# Patient Record
Sex: Female | Born: 1955 | Race: White | Smoking: Never smoker
Health system: Southern US, Community
[De-identification: ages and names within clinical notes are randomized; demographics above are authoritative.]

## PROBLEM LIST (undated history)

## (undated) DIAGNOSIS — D649 Anemia, unspecified: Secondary | ICD-10-CM

## (undated) DIAGNOSIS — G43909 Migraine, unspecified, not intractable, without status migrainosus: Secondary | ICD-10-CM

## (undated) DIAGNOSIS — I82409 Acute embolism and thrombosis of unspecified deep veins of unspecified lower extremity: Secondary | ICD-10-CM

## (undated) HISTORY — DX: Migraine, unspecified, not intractable, without status migrainosus: G43.909

## (undated) HISTORY — DX: Acute embolism and thrombosis of unspecified deep veins of unspecified lower extremity: I82.409

## (undated) HISTORY — DX: Anemia, unspecified: D64.9

---

## 2008-08-05 ENCOUNTER — Ambulatory Visit: Payer: Self-pay | Admitting: Internal Medicine

## 2008-10-10 ENCOUNTER — Ambulatory Visit: Payer: Self-pay | Admitting: Internal Medicine

## 2008-11-28 LAB — HM MAMMOGRAPHY: HM Mammogram: NORMAL

## 2011-11-29 ENCOUNTER — Ambulatory Visit (INDEPENDENT_AMBULATORY_CARE_PROVIDER_SITE_OTHER): Payer: BC Managed Care – PPO | Admitting: Internal Medicine

## 2011-11-29 ENCOUNTER — Encounter: Payer: Self-pay | Admitting: Internal Medicine

## 2011-11-29 VITALS — BP 100/60 | HR 76 | Temp 98.7°F | Wt 114.0 lb

## 2011-11-29 DIAGNOSIS — R4189 Other symptoms and signs involving cognitive functions and awareness: Secondary | ICD-10-CM

## 2011-11-29 DIAGNOSIS — R5383 Other fatigue: Secondary | ICD-10-CM

## 2011-11-29 DIAGNOSIS — R419 Unspecified symptoms and signs involving cognitive functions and awareness: Secondary | ICD-10-CM | POA: Insufficient documentation

## 2011-11-29 DIAGNOSIS — Z13220 Encounter for screening for lipoid disorders: Secondary | ICD-10-CM

## 2011-11-29 DIAGNOSIS — G43109 Migraine with aura, not intractable, without status migrainosus: Secondary | ICD-10-CM | POA: Insufficient documentation

## 2011-11-29 DIAGNOSIS — G43909 Migraine, unspecified, not intractable, without status migrainosus: Secondary | ICD-10-CM | POA: Insufficient documentation

## 2011-11-29 MED ORDER — ONDANSETRON 4 MG PO TBDP: ORAL_TABLET | ORAL | Status: DC

## 2011-11-29 MED ORDER — SUMATRIPTAN SUCCINATE 50 MG PO TABS: 50.0000 mg | ORAL_TABLET | ORAL | Status: DC | PRN

## 2011-11-29 NOTE — Assessment & Plan Note (Addendum)
Given the recent change in pattern accompanied by tinnitus and vision changes I am or an MRI the brain to rule out acoustic schwannoma and a second tiny lesion. In the meantime she does not want abortive therapy but would rather try to try prophylactic therapy but would prefer to try abortive therapy at this time. Given her prescription for Imitrex 50 mg one tablet at onset of headache may repeat in 2 hours. I've also given her samples Naprelan to take at the same time as well as prescription for Zofran she's to take all 3 of these at once.

## 2011-11-29 NOTE — Progress Notes (Signed)
Patient ID: Lisa Lamb, female   DOB: 07/31/1955, 56 y.o.   MRN: [id number]  Patient Active Problem List  Diagnosis  . Migraine headache with aura  . Migraine headache  . Cognitive complaints    Subjective:  CC:   Chief Complaint  Patient presents with  . Migraine    Frequency of once a week.  Patient takes ASA.      HPI:   Lisa Lamb a 56 y.o. female who presents 1) recurrent headaches 2) cognitive difficulties. Lisa Lamb is a 66 her old white female with a remote history of migraine headaches who presents today the patient she was last seen in over a year and half ago he'll practice and no records are available. She states that for the last several months she has been having weekly migraine headaches lasting 4-5 days which start with a visual scotoma. She describes a white fuzzy spot that  appears in the center of her visual field  and increases in size and is very quickly accompanied by onset of  nausea and vomiting.. She also notes  visual phenomena with changes of light and dark alternating between the 2,  followed by blurring of vision which last for several days. The headaches last 4-5 days and for the last 3 or 4 days is hallmarked by a sharp pain that occurs on the right side of her head starting in the frontal area  and and being in the occipital area. No neck stiffness. She has no history of trauma. She has been taking N. medication called blueberry which has not been helping. She has cut out caffeine chocolate wheat and sugar in the last 2 weeks but has not seen any effect on the headaches yet.  she also notes persistent ringing in both ears.She has had an eye exam in the last year and has been prescribed reading glasses.  no history or exposure to loud noises. She denies any vertigo or ultrasounds of balance  2) Describes her cognitive changes as a brain fog which is intermittent. She reports having difficulty remembering the names of people that she is known for years when she is  put on a spot to introduce them. She has no trouble finding destinations or murmur she parked her car. She does note some recent development of insomnia with early morning wakening followed by several hours of sleeplessness. She does not note any increased stress although she does have some financial stressors regarding the medical bills from her 3 horses who are all losing her eyesight. She also has a son who has a history of seizure disorder. There several people in her family including her daughter who has a history of migraine disorder.    Past Medical History  Diagnosis Date  . Migraine headache     Past Surgical History  Procedure Date  . Cesarean section 1988         The following portions of the patient's history were reviewed and updated as appropriate: Allergies, current medications, and problem list.    Review of Systems:   12 Pt  review of systems was negative except those addressed in the HPI,     History   Social History  . Marital Status: Married    Spouse Name: N/A    Number of Children: N/A  . Years of Education: N/A   Occupational History  . Not on file.   Social History Main Topics  . Smoking status: Never Smoker   . Smokeless tobacco: Not  on file  . Alcohol Use: Yes  . Drug Use: No  . Sexually Active: Not on file   Other Topics Concern  . Not on file   Social History Narrative  . No narrative on file    Objective:  BP 100/60  Pulse 76  Temp 98.7 F (37.1 C) (Oral)  Wt 114 lb (51.71 kg)  SpO2 98%  General appearance: alert, cooperative and appears stated age Ears: normal TM's and external ear canals both ears Throat: lips, mucosa, and tongue normal; teeth and gums normal Neck: no adenopathy, no carotid bruit, supple, symmetrical, trachea midline and thyroid not enlarged, symmetric, no tenderness/mass/nodules Back: symmetric, no curvature. ROM normal. No CVA tenderness. Lungs: clear to auscultation bilaterally Heart: regular rate  and rhythm, S1, S2 normal, no murmur, click, rub or gallop Abdomen: soft, non-tender; bowel sounds normal; no masses,  no organomegaly Pulses: 2+ and symmetric Skin: Skin color, texture, turgor normal. No rashes or lesions Lymph nodes: Cervical, supraclavicular, and axillary nodes normal. Neuro: grossly nonfocal. Finger to nose intact no nystagmus. Negative Romberg    Assessment and Plan:  Migraine headache with aura Given the recent change in pattern accompanied by tinnitus and vision changes I am ordering an MRI the brain to rule out acoustic schwannoma and a space occupying lesion. In the meantime she does not want prophylactic therapy but would prefer to try abortive therapy at this time. I have given her prescription for Imitrex 50 mg one tablet at onset of headache may repeat in 2 hours. I've also given her samples Naprelan to take at the same time as well as prescription for Zofran she's to take all 3 of these at once.  Cognitive complaints She is postmenopausal her symptoms may be coming from menopause versus insomnia versus anxiety. Her she has not had blood work in over a year. I will check thyroid function as well as a screen for diabetes and hyperlipidemia.   Updated Medication List Outpatient Encounter Prescriptions as of 11/29/2011  Medication Sig Dispense Refill  . ondansetron (ZOFRAN-ODT) 4 MG disintegrating tablet 1 or 2 tablets every 8 hours as neded for nausea  30 tablet  3  . SUMAtriptan (IMITREX) 50 MG tablet Take 1 tablet (50 mg total) by mouth every 2 (two) hours as needed for migraine (MAXIMUM 2 DOSES IN 24 HR PERIOD).  10 tablet  0     Orders Placed This Encounter  Procedures  . MR Brain Wo Contrast  . Comprehensive metabolic panel  . CBC with Differential  . TSH  . Lipid panel    No Follow-up on file.

## 2011-11-29 NOTE — Assessment & Plan Note (Signed)
She is postmenopausal her symptoms may be coming from menopause versus insomnia versus anxiety. Her she has not had blood work in over a year. I will check thyroid function as well as a screen for diabetes and hyperlipidemia.

## 2011-11-29 NOTE — Patient Instructions (Addendum)
Use imitrex and naprelan and zofran at first sign of headache.  \May repeat zofran and imitrex if needed  MRI of brain to be ordered to rule out tumors

## 2011-12-02 ENCOUNTER — Ambulatory Visit: Payer: Self-pay | Admitting: Internal Medicine

## 2011-12-03 ENCOUNTER — Telehealth: Payer: Self-pay | Admitting: Internal Medicine

## 2011-12-03 NOTE — Telephone Encounter (Signed)
Left message on patient vm letting her know her mammogram results.

## 2011-12-03 NOTE — Telephone Encounter (Signed)
MRI of her brain that we got because of her increased headache pattern was normal. No masses strokes or abnormalities.

## 2011-12-13 ENCOUNTER — Encounter: Payer: Self-pay | Admitting: Internal Medicine

## 2011-12-20 ENCOUNTER — Ambulatory Visit: Payer: Self-pay | Admitting: Internal Medicine

## 2011-12-20 ENCOUNTER — Encounter: Payer: Self-pay | Admitting: Internal Medicine

## 2011-12-20 ENCOUNTER — Ambulatory Visit (INDEPENDENT_AMBULATORY_CARE_PROVIDER_SITE_OTHER): Payer: BC Managed Care – PPO | Admitting: Internal Medicine

## 2011-12-20 VITALS — BP 112/68 | HR 60 | Temp 98.2°F | Ht 65.5 in | Wt 116.0 lb

## 2011-12-20 DIAGNOSIS — M542 Cervicalgia: Secondary | ICD-10-CM | POA: Insufficient documentation

## 2011-12-20 NOTE — Assessment & Plan Note (Signed)
Recurrent pains suggestive of cervical radiculopathy. Has history of trauma from falling off a horse 6 months ago.  Plain films of cervical spine ordered.

## 2011-12-20 NOTE — Progress Notes (Signed)
Patient ID: ESTELLAR Lamb, female   DOB: Dec 07, 1955, 56 y.o.   MRN: [id number]  Patient Active Problem List  Diagnosis  . Migraine headache with aura  . Migraine headache  . Cognitive complaints  . Cervicalgia    Subjective:  CC:   Chief Complaint  Patient presents with  . Follow-up    HPI:   Lisa Lamb a 56 y.o. female who presents   Past Medical History  Diagnosis Date  . Migraine headache     Past Surgical History  Procedure Date  . Cesarean section 1988      Follow up on migraines .  Normal MRI.,  Has not had a migraine in several weeks.  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  The sharp pains occurr when she drives and when she feels stressed out.  Not occurring on a daily basis but last for a few minutes.  Not sleeping well secondary to neck pain.  No MVAs but has fallen off her horse six months ago.      The following portions of the patient's history were reviewed and updated as appropriate: Allergies, current medications, and problem list.    Review of Systems:   12 Pt  review of systems was negative except those addressed in the HPI,     History   Social History  . Marital Status: Married    Spouse Name: N/A    Number of Children: N/A  . Years of Education: N/A   Occupational History  . Not on file.   Social History Main Topics  . Smoking status: Never Smoker   . Smokeless tobacco: Not on file  . Alcohol Use: Yes  . Drug Use: No  . Sexually Active: Not on file   Other Topics Concern  . Not on file   Social History Narrative  . No narrative on file    Objective:  BP 112/68  Pulse 60  Temp 98.2 F (36.8 C) (Oral)  Ht 5' 5.5" (1.664 m)  Wt 116 lb (52.617 kg)  BMI 19.01 kg/m2  SpO2 97%  General appearance: alert, cooperative and appears stated age Ears: normal TM's and external ear canals both  ears Throat: lips, mucosa, and tongue normal; teeth and gums normal Neck: no adenopathy, no carotid bruit, supple, symmetrical, trachea midline and thyroid not enlarged, symmetric, no tenderness/mass/nodules Back: symmetric, no curvature. ROM normal. No CVA tenderness. Lungs: clear to auscultation bilaterally Heart: regular rate and rhythm, S1, S2 normal, no murmur, click, rub or gallop Abdomen: soft, non-tender; bowel sounds normal; no masses,  no organomegaly Pulses: 2+ and symmetric Skin: Skin color, texture, turgor normal. No rashes or lesions Lymph nodes: Cervical, supraclavicular, and axillary nodes normal.  Assessment and Plan:  Cervicalgia Recurrent pains suggestive of cervical radiculopathy. Has history of trauma from falling off a horse 6 months ago.  Plain films of cervical spine ordered.    Updated Medication List Outpatient Encounter Prescriptions as of 12/20/2011  Medication Sig Dispense Refill  . ondansetron (ZOFRAN-ODT) 4 MG disintegrating tablet 1 or 2 tablets every 8 hours as neded for nausea  30 tablet  3  . SUMAtriptan (IMITREX) 50 MG tablet Take 1 tablet (50 mg total) by mouth every 2 (two) hours as needed for migraine (MAXIMUM 2 DOSES IN 24 HR PERIOD).  10 tablet  0  Orders Placed This Encounter  Procedures  . DG Cervical Spine Complete  . Ambulatory referral to Neurology    No Follow-up on file.

## 2011-12-22 ENCOUNTER — Telehealth: Payer: Self-pay | Admitting: Internal Medicine

## 2011-12-22 NOTE — Telephone Encounter (Signed)
The x rays done at [facility] of her neck did not show any alignment issues or fractures. I would like to send her to a headache clinic if she agrees.

## 2011-12-27 ENCOUNTER — Encounter: Payer: Self-pay | Admitting: Internal Medicine

## 2012-03-06 ENCOUNTER — Other Ambulatory Visit: Payer: Self-pay | Admitting: Internal Medicine

## 2012-03-06 NOTE — Telephone Encounter (Signed)
Refill sumatriptan 50 mg .

## 2012-03-07 ENCOUNTER — Other Ambulatory Visit: Payer: Self-pay | Admitting: General Practice

## 2012-03-07 DIAGNOSIS — G43109 Migraine with aura, not intractable, without status migrainosus: Secondary | ICD-10-CM

## 2012-03-07 MED ORDER — SUMATRIPTAN SUCCINATE 50 MG PO TABS: 50.0000 mg | ORAL_TABLET | ORAL | Status: DC | PRN

## 2012-03-07 NOTE — Telephone Encounter (Signed)
imitrex filled on 03/07/12.

## 2012-04-10 ENCOUNTER — Other Ambulatory Visit: Payer: Self-pay | Admitting: General Practice

## 2012-04-10 DIAGNOSIS — G43109 Migraine with aura, not intractable, without status migrainosus: Secondary | ICD-10-CM

## 2012-04-10 MED ORDER — SUMATRIPTAN SUCCINATE 50 MG PO TABS: 50.0000 mg | ORAL_TABLET | ORAL | Status: DC | PRN

## 2012-04-28 ENCOUNTER — Encounter: Payer: Self-pay | Admitting: Internal Medicine

## 2012-04-28 ENCOUNTER — Ambulatory Visit (INDEPENDENT_AMBULATORY_CARE_PROVIDER_SITE_OTHER): Payer: BC Managed Care – PPO | Admitting: Internal Medicine

## 2012-04-28 VITALS — BP 114/60 | HR 73 | Temp 98.3°F | Resp 16 | Wt 108.0 lb

## 2012-04-28 DIAGNOSIS — G43109 Migraine with aura, not intractable, without status migrainosus: Secondary | ICD-10-CM

## 2012-04-28 DIAGNOSIS — I8390 Asymptomatic varicose veins of unspecified lower extremity: Secondary | ICD-10-CM

## 2012-04-28 DIAGNOSIS — M542 Cervicalgia: Secondary | ICD-10-CM

## 2012-04-28 MED ORDER — SUMATRIPTAN 20 MG/ACT NA SOLN: 1.0000 | NASAL | Status: DC | PRN

## 2012-04-28 NOTE — Progress Notes (Signed)
Patient ID: Lisa Lamb, female   DOB: 1955-07-25, 57 y.o.   MRN: [id number]   Patient Active Problem List  Diagnosis  . Migraine headache with aura  . Migraine headache  . Cognitive complaints  . Cervicalgia    Subjective:  CC:   Chief Complaint  Patient presents with  . Headache  . Discuss Veins in legs    HPI:   Lisa Greenwell Floodis a 57 y.o. female who presents for follow up on  1) Recurrent headaches.  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 She finds that the Imitrex is working occasionally on her migraines but not always. She has noted that her frequency of migraines has been reduced from 5 per month to 2 or 3 per month with avoidance of caffeine and other dietary restraints.   2) painful  disfiguring varicose veins. She is requesting referral to vascular surgeon definitive treatment.   Past Medical History  Diagnosis Date  . Migraine headache     Past Surgical History  Procedure Laterality Date  . Cesarean section  1988       The following portions of the patient's history were reviewed and updated as appropriate: Allergies, current medications, and problem list.    Review of Systems:   Patient denies headache, fevers, malaise, unintentional weight loss, skin rash, eye pain, sinus congestion and sinus pain, sore throat, dysphagia,  hemoptysis , cough, dyspnea, wheezing, chest pain, palpitations, orthopnea, edema, abdominal pain, nausea, melena, diarrhea, constipation, flank pain, dysuria, hematuria, urinary  Frequency, nocturia, numbness, tingling, seizures,  Focal weakness, Loss of consciousness,  Tremor, insomnia, depression, anxiety, and suicidal ideation.      History   Social History  . Marital Status: Married    Spouse Name: N/A    Number of Children: N/A  . Years of Education: N/A   Occupational History  . Not on file.   Social History Main Topics  . Smoking status: Never  Smoker   . Smokeless tobacco: Not on file  . Alcohol Use: Yes  . Drug Use: No  . Sexually Active: Not on file   Other Topics Concern  . Not on file   Social History Narrative  . No narrative on file    Objective:  BP 114/60  Pulse 73  Temp(Src) 98.3 F (36.8 C) (Oral)  Resp 16  Wt 108 lb (48.988 kg)  BMI 17.69 kg/m2  SpO2 98%  General appearance: alert, cooperative and appears stated age Ears: normal TM's and external ear canals both ears Throat: lips, mucosa, and tongue normal; teeth and gums normal Neck: no adenopathy, no carotid bruit, supple, symmetrical, trachea midline and thyroid not enlarged, symmetric, no tenderness/mass/nodules Back: symmetric, no curvature. ROM normal. No CVA tenderness. Lungs: clear to auscultation bilaterally Heart: regular rate and rhythm, S1, S2 normal, no murmur, click, rub or gallop Abdomen: soft, non-tender; bowel sounds normal; no masses,  no organomegaly Pulses: 2+ and symmetric Skin: Skin color, texture, turgor normal. No rashes or lesions Lymph nodes: Cervical, supraclavicular, and axillary nodes normal.  Assessment and Plan:  Migraine headache with aura Continue use of Imitrex as needed. We discussed prophylactic/preventive therapy with daily use of Elavil.she does not want to start preventive therapy at this time. Her main concern was wondering in she has a headache which is about whether she is having a stroke. I've reassured her that she has no risk factors for stroke. Her MRI last October was  normal. She has no history of hypertension blood clots tobacco history or hyperlipidemia.she is requesting referral to a "functional neurologist" and has a Charity fundraiser in [place] in mind. She will call in with his name to arrange referral.  Cervicalgia She is requesting referral to chiropractor. I have recommended Dr. Patrici Ranks since he uses  chiropractic medicine as well as acupuncture to treat headaches fro cervical disk disease.  Marland Kitchen   Updated Medication List Outpatient Encounter Prescriptions as of 04/28/2012  Medication Sig Dispense Refill  . ondansetron (ZOFRAN-ODT) 4 MG disintegrating tablet 1 or 2 tablets every 8 hours as neded for nausea  30 tablet  3  . [DISCONTINUED] SUMAtriptan (IMITREX) 50 MG tablet Take 1 tablet (50 mg total) by mouth every 2 (two) hours as needed for migraine (MAXIMUM 2 DOSES IN 24 HR PERIOD).  10 tablet  0  . SUMAtriptan (IMITREX) 20 MG/ACT nasal spray Place 1 spray (20 mg total) into the nose every 2 (two) hours as needed for migraine.  4 Inhaler  1   No facility-administered encounter medications on file as of 04/28/2012.     Orders Placed This Encounter  Procedures  . Ambulatory referral to Vascular Surgery    No Follow-up on file.

## 2012-04-28 NOTE — Patient Instructions (Addendum)
Return for fasting labs.  Make an appt for the lab visit   E mail me the name of the neurologist you want to see.  Dr. Patrici Ranks is the chiropractor I recommend,   Referral to Dr Wyn Quaker  Or Dr Gilda Crease. For treatment of your vascular veins.

## 2012-04-30 ENCOUNTER — Encounter: Payer: Self-pay | Admitting: Internal Medicine

## 2012-04-30 NOTE — Assessment & Plan Note (Addendum)
Continue use of Imitrex as needed. We discussed prophylactic/preventive therapy with daily use of Elavil.she does not want to start preventive therapy at this time. Her main concern was wondering in she has a headache which is about whether she is having a stroke. I've reassured her that she has no risk factors for stroke. Her MRI last October was normal. She has no history of hypertension blood clots tobacco history or hyperlipidemia.she is requesting referral to a "functional neurologist" and has a Charity fundraiser in [place] in mind. She will call in with his name to arrange referral.

## 2012-04-30 NOTE — Assessment & Plan Note (Signed)
She is requesting referral to chiropractor. I have recommended Dr. Patrici Ranks since he uses  chiropractic medicine as well as acupuncture to treat headaches fro cervical disk disease. Marland Kitchen

## 2012-05-24 ENCOUNTER — Telehealth: Payer: Self-pay | Admitting: Emergency Medicine

## 2012-05-24 NOTE — Telephone Encounter (Signed)
The patient called earlier today and stated she was aggravted with [facility], and the pt wanted to see [facility]. I have made her an apt for June 2nd. She is aware of this apt. I told her that I would call [facility] to see what the issue is on why they keep "putting her off".

## 2012-05-26 ENCOUNTER — Other Ambulatory Visit: Payer: Self-pay

## 2012-05-26 DIAGNOSIS — I83893 Varicose veins of bilateral lower extremities with other complications: Secondary | ICD-10-CM

## 2012-06-22 ENCOUNTER — Telehealth: Payer: Self-pay | Admitting: Internal Medicine

## 2012-06-22 NOTE — Telephone Encounter (Signed)
Pt needing refill of Imitrex.  Wants to go back to 50 mg tablets.

## 2012-06-27 ENCOUNTER — Other Ambulatory Visit: Payer: Self-pay | Admitting: Internal Medicine

## 2012-06-27 NOTE — Telephone Encounter (Signed)
Pt wants to go back to 50 mg tablets, ok to refill?

## 2012-07-03 ENCOUNTER — Telehealth: Payer: Self-pay | Admitting: Internal Medicine

## 2012-07-03 NOTE — Telephone Encounter (Signed)
Received Emergent call from this pt but no one was on the line.  I called her name several times with no response.  I hung up an immediately called her back and then got a busy signal.

## 2012-07-03 NOTE — Telephone Encounter (Signed)
i see nothing I can schedule your appointments are full please advise.

## 2012-07-03 NOTE — Telephone Encounter (Signed)
Patient wants to be seen this week . She did not want to see the nurse practitioner or be transferred back to the triage nurse.

## 2012-07-03 NOTE — Telephone Encounter (Signed)
I cannot keep overbooking. If she does not want to see Raquel, she will have to wait until I have a cancellation

## 2012-07-04 ENCOUNTER — Ambulatory Visit (INDEPENDENT_AMBULATORY_CARE_PROVIDER_SITE_OTHER): Payer: BC Managed Care – PPO | Admitting: Adult Health

## 2012-07-04 ENCOUNTER — Ambulatory Visit: Payer: Self-pay

## 2012-07-04 ENCOUNTER — Encounter: Payer: Self-pay | Admitting: Adult Health

## 2012-07-04 VITALS — BP 98/56 | HR 68 | Temp 98.5°F | Resp 12 | Wt 108.0 lb

## 2012-07-04 DIAGNOSIS — M7989 Other specified soft tissue disorders: Secondary | ICD-10-CM

## 2012-07-04 NOTE — Progress Notes (Signed)
  Subjective:    Patient ID: Lisa Lamb, female    DOB: 03-05-1955, 57 y.o.   MRN: [id number]  HPI  Patient is a pleasant 57 year old female who presents to clinic with the following concerns: Left upper extremity soreness, edema. Arm is larger than the right. She reports the area on the left upper extremity is warm to touch. She has also felt "bumps" that have been traveling up her arm. Hx of right lower extremity blood clot. She denies any chest pain or shortness of breath.   Current Outpatient Prescriptions on File Prior to Visit  Medication Sig Dispense Refill  . ondansetron (ZOFRAN-ODT) 4 MG disintegrating tablet 1 or 2 tablets every 8 hours as neded for nausea  30 tablet  3  . SUMAtriptan (IMITREX) 50 MG tablet take 1 tablet by mouth every 2 hours if needed for migraines (MAXIMUM OF 2 DOSES IN A 24HR PERIOD)  10 tablet  5  . SUMAtriptan (IMITREX) 20 MG/ACT nasal spray Place 1 spray (20 mg total) into the nose every 2 (two) hours as needed for migraine.  4 Inhaler  1   No current facility-administered medications on file prior to visit.     Review of Systems  Constitutional: Negative.   Respiratory: Negative.   Cardiovascular:       Edema Left upper extremity  Psychiatric/Behavioral: Negative.       BP 98/56  Pulse 68  Temp(Src) 98.5 F (36.9 C) (Oral)  Resp 12  Wt 108 lb (48.988 kg)  BMI 17.69 kg/m2  SpO2 98%    Objective:   Physical Exam  Constitutional: She is oriented to person, place, and time. She appears well-developed and well-nourished. No distress.  Cardiovascular: Normal rate, regular rhythm, normal heart sounds and intact distal pulses.   Pulmonary/Chest: Effort normal and breath sounds normal. No respiratory distress. She has no wheezes. She has no rales.  Musculoskeletal: She exhibits edema.  Neurological: She is alert and oriented to person, place, and time.  Skin: Skin is warm and dry.  Slight erythema and warmth of the left upper extremity   Psychiatric: She has a normal mood and affect. Her behavior is normal. Judgment and thought content normal.          Assessment & Plan:

## 2012-07-04 NOTE — Assessment & Plan Note (Signed)
Rule out DVT. Send for ultrasound Doppler today.

## 2012-07-04 NOTE — Patient Instructions (Addendum)
  We are scheduling you for an upper extremity ultrasound Doppler today to evaluate for a blood clot.

## 2012-07-04 NOTE — Telephone Encounter (Signed)
Appointment made with Orville Govern NP today at 3.00 PM

## 2012-07-11 ENCOUNTER — Telehealth: Payer: Self-pay | Admitting: *Deleted

## 2012-07-11 DIAGNOSIS — R599 Enlarged lymph nodes, unspecified: Secondary | ICD-10-CM

## 2012-07-11 NOTE — Telephone Encounter (Signed)
Spoke with pt and notified of US Doppler of left arm results and recommending CT chest without contrast for noted enlarged lymph nodes per Orville Govern, NP. Pt states she is not a smoker and verbalized understanding of need for followup CT.

## 2012-07-12 NOTE — Telephone Encounter (Signed)
Returned you call.    [phone]  home

## 2012-07-18 ENCOUNTER — Ambulatory Visit: Payer: Self-pay | Admitting: Internal Medicine

## 2012-07-18 ENCOUNTER — Encounter: Payer: BC Managed Care – PPO | Admitting: Vascular Surgery

## 2012-07-19 ENCOUNTER — Telehealth: Payer: Self-pay | Admitting: Internal Medicine

## 2012-07-19 DIAGNOSIS — R599 Enlarged lymph nodes, unspecified: Secondary | ICD-10-CM

## 2012-07-19 NOTE — Telephone Encounter (Signed)
CT of the chest shows several enlarged lymph nodes. One of the lymph nodes is in the left axillary region. Referral to surgeon to see if any one of the lymph nodes may be biopsied.  CT chest showing enlarged left axillary lymph nodes and left subpectoral lymph node. 6 dominant nodules in right middle lobe. Patient initially presented to office with left swollen arm. Sent for ultrasound which was negative for DVT but showed lymph nodes enlarged. Then sent for CT.

## 2012-07-19 NOTE — Telephone Encounter (Signed)
Pt notified of results. Agrees with a referral to general surgeon, does not have preference. Pt will be out of town, leaving tomorrow, for 10 days, requests to be called with appointment first week in June, towards the end of the week. States will not be checking her cell phone while on vacation to receive calls.

## 2012-07-31 ENCOUNTER — Encounter: Payer: BC Managed Care – PPO | Admitting: Surgery

## 2012-08-10 ENCOUNTER — Ambulatory Visit (INDEPENDENT_AMBULATORY_CARE_PROVIDER_SITE_OTHER): Payer: BC Managed Care – PPO | Admitting: Surgery

## 2012-08-15 ENCOUNTER — Encounter: Payer: Self-pay | Admitting: Vascular Surgery

## 2012-08-16 ENCOUNTER — Encounter (INDEPENDENT_AMBULATORY_CARE_PROVIDER_SITE_OTHER): Payer: BC Managed Care – PPO | Admitting: Vascular Surgery

## 2012-08-16 ENCOUNTER — Encounter: Payer: Self-pay | Admitting: Vascular Surgery

## 2012-08-16 ENCOUNTER — Ambulatory Visit (INDEPENDENT_AMBULATORY_CARE_PROVIDER_SITE_OTHER): Payer: BC Managed Care – PPO | Admitting: Vascular Surgery

## 2012-08-16 VITALS — BP 114/56 | HR 77 | Ht 65.5 in | Wt 104.4 lb

## 2012-08-16 DIAGNOSIS — I83893 Varicose veins of bilateral lower extremities with other complications: Secondary | ICD-10-CM

## 2012-08-16 NOTE — Progress Notes (Signed)
Vascular and Vein Specialist of [facility]  Patient name: Lisa Lamb MRN: [id number] DOB: 24-Nov-1955 Sex: female  REASON FOR CONSULT: varicose veins.  HPI: Lisa Lamb is a 57 y.o. female who presents with a long history of varicose veins. She experiences aching pain especially in her right leg which is brought on by standing and sitting for a prolonged period of time. She has symptoms bilaterally but more significantly on the right side. There are no other aggravating or alleviating factors. She is unaware of any history of phlebitis. She does state that approximately 30 years ago, when she was in college, she had a DVT in the posterior aspect of her right calf. He does occasionally experience some right lower extremity swelling.  Past Medical History  Diagnosis Date  . Migraine headache   . DVT (deep venous thrombosis)   . Anemia    Family History  Problem Relation Age of Onset  . Cancer Mother 63    ovarian  . Heart disease Father     before age 50  . Cancer Father   . Heart attack Father   . Other Father     varicose veins  . Heart disease Maternal Grandmother   . Stroke Maternal Grandmother    SOCIAL HISTORY: History  Substance Use Topics  . Smoking status: Never Smoker   . Smokeless tobacco: Never Used  . Alcohol Use: 1.2 oz/week    2 Glasses of wine per week   No Known Allergies  Current Outpatient Prescriptions  Medication Sig Dispense Refill  . doxycycline (VIBRA-TABS) 100 MG tablet Take 1 tablet by mouth 2 (two) times daily.      . ondansetron (ZOFRAN-ODT) 4 MG disintegrating tablet 1 or 2 tablets every 8 hours as neded for nausea  30 tablet  3  . SUMAtriptan (IMITREX) 20 MG/ACT nasal spray Place 1 spray (20 mg total) into the nose every 2 (two) hours as needed for migraine.  4 Inhaler  1  . SUMAtriptan (IMITREX) 50 MG tablet take 1 tablet by mouth every 2 hours if needed for migraines (MAXIMUM OF 2 DOSES IN A 24HR PERIOD)  10 tablet  5   No current  facility-administered medications for this visit.   REVIEW OF SYSTEMS: Arly.Keller ] denotes positive finding; [  ] denotes negative finding  CARDIOVASCULAR:  [ ]  chest pain   [ ]  chest pressure   [ ]  palpitations   [ ]  orthopnea   [ ]  dyspnea on exertion   [ ]  claudication   [ ]  rest pain   [ ]  DVT   [ ]  phlebitis PULMONARY:   [ ]  productive cough   [ ]  asthma   [ ]  wheezing NEUROLOGIC:   [ ]  weakness  [ ]  paresthesias  [ ]  aphasia  [ ]  amaurosis  [ ]  dizziness HEMATOLOGIC:   [ ]  bleeding problems   [ ]  clotting disorders MUSCULOSKELETAL:  [ ]  joint pain   [ ]  joint swelling Arly.Keller ] leg swelling GASTROINTESTINAL: [ ]   blood in stool  [ ]   hematemesis GENITOURINARY:  [ ]   dysuria  [ ]   hematuria PSYCHIATRIC:  [ ]  history of major depression INTEGUMENTARY:  [ ]  rashes  [ ]  ulcers CONSTITUTIONAL:  [ ]  fever   [ ]  chills  PHYSICAL EXAM: Filed Vitals:   08/16/12 1149  BP: 114/56  Pulse: 77  Height: 5' 5.5" (1.664 m)  Weight: 104 lb 6.4 oz (47.356 kg)  SpO2: 100%  Body mass index is 17.1 kg/(m^2). GENERAL: The patient is a well-nourished female, in no acute distress. The vital signs are documented above. CARDIOVASCULAR: There is a regular rate and rhythm. I do not detect carotid bruits. She has palpable femoral pulses and palpable pedal pulses bilaterally. PULMONARY: There is good air exchange bilaterally without wheezing or rales. ABDOMEN: Soft and non-tender with normal pitched bowel sounds.  MUSCULOSKELETAL: There are no major deformities or cyanosis. NEUROLOGIC: No focal weakness or paresthesias are detected. SKIN: There are no ulcers or rashes noted. PSYCHIATRIC: The patient has a normal affect. She has some distended varicose veins in the anterior aspect of her right leg. Spider veins and telangiectasias bilaterally.  DATA:  I have independently interpreted her venous duplex scan. 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 She also has incompetence of the right lesser saphenous vein beneath this. This is likely the site where she had a previous DVT when she was in college 30 years ago. The great and small saphenous vein on the left are competent. She does have some deep venous reflux on the right in the popliteal vein.  MEDICAL ISSUES:  This patient has some evidence of chronic venous insufficiency on the right side. She has a reflux of the right popliteal vein and also the small saphenous vein. This is likely contributing to the varicose veins on the anterior aspect of her right leg. Currently the diameters of the small saphenous vein on the left are fairly small she is likely not a candidate for venous ablation of this segment. We have discussed the importance of intermittent leg elevation and the proper positioning for this. In addition I have written her a prescription for knee-high compression stockings with a mild gradient of 15-20 mm of mercury as I think she would be more likely to wear these during the summer when his hot. We'll be happy to see her back at any time if any new issues arise or her symptoms progress.  Jobe Mutch S [facility] Specialists of [place] [facility]: [phone]

## 2012-08-30 ENCOUNTER — Encounter: Payer: Self-pay | Admitting: Internal Medicine

## 2012-10-04 ENCOUNTER — Ambulatory Visit: Payer: BC Managed Care – PPO | Admitting: *Deleted

## 2012-12-12 ENCOUNTER — Encounter: Payer: Self-pay | Admitting: *Deleted

## 2012-12-13 ENCOUNTER — Encounter: Payer: Self-pay | Admitting: Vascular Surgery

## 2012-12-13 ENCOUNTER — Ambulatory Visit (INDEPENDENT_AMBULATORY_CARE_PROVIDER_SITE_OTHER): Payer: BC Managed Care – PPO | Admitting: *Deleted

## 2012-12-13 ENCOUNTER — Encounter (INDEPENDENT_AMBULATORY_CARE_PROVIDER_SITE_OTHER): Payer: Self-pay

## 2012-12-13 DIAGNOSIS — I781 Nevus, non-neoplastic: Secondary | ICD-10-CM | POA: Insufficient documentation

## 2012-12-13 NOTE — Progress Notes (Signed)
X=.3% Sotradecol administered with a 27g butterfly.  Patient received a total of 12cc.  Treated a large reticular vein on her right chin and scattered spider veins. Easy access and anticipate good results. Follow prn.  Photos: yes  Compression stockings applied: yes and an ace over the reticular

## 2012-12-20 ENCOUNTER — Ambulatory Visit: Payer: BC Managed Care – PPO | Admitting: *Deleted

## 2013-03-20 ENCOUNTER — Other Ambulatory Visit: Payer: Self-pay | Admitting: Internal Medicine

## 2013-04-06 ENCOUNTER — Encounter: Payer: Self-pay | Admitting: Internal Medicine

## 2013-04-06 ENCOUNTER — Ambulatory Visit (INDEPENDENT_AMBULATORY_CARE_PROVIDER_SITE_OTHER): Payer: BC Managed Care – PPO | Admitting: Internal Medicine

## 2013-04-06 VITALS — BP 108/60 | HR 70 | Temp 98.1°F | Resp 16 | Wt 109.0 lb

## 2013-04-06 DIAGNOSIS — Z1239 Encounter for other screening for malignant neoplasm of breast: Secondary | ICD-10-CM

## 2013-04-06 DIAGNOSIS — E559 Vitamin D deficiency, unspecified: Secondary | ICD-10-CM

## 2013-04-06 DIAGNOSIS — R5383 Other fatigue: Secondary | ICD-10-CM

## 2013-04-06 DIAGNOSIS — Z23 Encounter for immunization: Secondary | ICD-10-CM

## 2013-04-06 DIAGNOSIS — R419 Unspecified symptoms and signs involving cognitive functions and awareness: Secondary | ICD-10-CM

## 2013-04-06 DIAGNOSIS — M542 Cervicalgia: Secondary | ICD-10-CM

## 2013-04-06 DIAGNOSIS — R4589 Other symptoms and signs involving emotional state: Secondary | ICD-10-CM

## 2013-04-06 DIAGNOSIS — F410 Panic disorder [episodic paroxysmal anxiety] without agoraphobia: Secondary | ICD-10-CM

## 2013-04-06 DIAGNOSIS — G43109 Migraine with aura, not intractable, without status migrainosus: Secondary | ICD-10-CM

## 2013-04-06 DIAGNOSIS — R5381 Other malaise: Secondary | ICD-10-CM

## 2013-04-06 DIAGNOSIS — R4189 Other symptoms and signs involving cognitive functions and awareness: Secondary | ICD-10-CM

## 2013-04-06 DIAGNOSIS — Z13220 Encounter for screening for lipoid disorders: Secondary | ICD-10-CM

## 2013-04-06 DIAGNOSIS — F430 Acute stress reaction: Secondary | ICD-10-CM

## 2013-04-06 LAB — CBC WITH DIFFERENTIAL/PLATELET
BASOS ABS: 0 10*3/uL (ref 0.0–0.1)
Basophils Relative: 0.3 % (ref 0.0–3.0)
Eosinophils Absolute: 0.2 10*3/uL (ref 0.0–0.7)
Eosinophils Relative: 2.5 % (ref 0.0–5.0)
HEMATOCRIT: 39.4 % (ref 36.0–46.0)
HEMOGLOBIN: 12.8 g/dL (ref 12.0–15.0)
Lymphocytes Relative: 32.1 % (ref 12.0–46.0)
Lymphs Abs: 2.1 10*3/uL (ref 0.7–4.0)
MCHC: 32.6 g/dL (ref 30.0–36.0)
MCV: 91.9 fl (ref 78.0–100.0)
MONOS PCT: 8.1 % (ref 3.0–12.0)
Monocytes Absolute: 0.5 10*3/uL (ref 0.1–1.0)
NEUTROS ABS: 3.8 10*3/uL (ref 1.4–7.7)
Neutrophils Relative %: 57 % (ref 43.0–77.0)
Platelets: 236 10*3/uL (ref 150.0–400.0)
RBC: 4.28 Mil/uL (ref 3.87–5.11)
RDW: 14.4 % (ref 11.5–14.6)
WBC: 6.6 10*3/uL (ref 4.5–10.5)

## 2013-04-06 LAB — TSH: TSH: 0.47 u[IU]/mL (ref 0.35–5.50)

## 2013-04-06 MED ORDER — ALPRAZOLAM 0.5 MG PO TABS: 0.5000 mg | ORAL_TABLET | Freq: Every day | ORAL | Status: DC | PRN

## 2013-04-06 NOTE — Patient Instructions (Signed)
Trial of generic zanax to use as needed for panic attacks.  Start with 1/2 tablet repeat in 20 minutes if no relief    Insomnia Insomnia is frequent trouble falling and/or staying asleep. Insomnia can be a long term problem or a short term problem. Both are common. Insomnia can be a short term problem when the wakefulness is related to a certain stress or worry. Long term insomnia is often related to ongoing stress during waking hours and/or poor sleeping habits. Overtime, sleep deprivation itself can make the problem worse. Every little thing feels more severe because you are overtired and your ability to cope is decreased. CAUSES   Stress, anxiety, and depression.  Poor sleeping habits.  Distractions such as TV in the bedroom.  Naps close to bedtime.  Engaging in emotionally charged conversations before bed.  Technical reading before sleep.  Alcohol and other sedatives. They may make the problem worse. They can hurt normal sleep patterns and normal dream activity.  Stimulants such as caffeine for several hours prior to bedtime.  Pain syndromes and shortness of breath can cause insomnia.  Exercise late at night.  Changing time zones may cause sleeping problems (jet lag). It is sometimes helpful to have someone observe your sleeping patterns. They should look for periods of not breathing during the night (sleep apnea). They should also look to see how long those periods last. If you live alone or observers are uncertain, you can also be observed at a sleep clinic where your sleep patterns will be professionally monitored. Sleep apnea requires a checkup and treatment. Give your caregivers your medical history. Give your caregivers observations your family has made about your sleep.  SYMPTOMS   Not feeling rested in the morning.  Anxiety and restlessness at bedtime.  Difficulty falling and staying asleep. TREATMENT   Your caregiver may prescribe treatment for an underlying medical  disorders. Your caregiver can give advice or help if you are using alcohol or other drugs for self-medication. Treatment of underlying problems will usually eliminate insomnia problems.  Medications can be prescribed for short time use. They are generally not recommended for lengthy use.  Over-the-counter sleep medicines are not recommended for lengthy use. They can be habit forming.  You can promote easier sleeping by making lifestyle changes such as:  Using relaxation techniques that help with breathing and reduce muscle tension.  Exercising earlier in the day.  Changing your diet and the time of your last meal. No night time snacks.  Establish a regular time to go to bed.  Counseling can help with stressful problems and worry.  Soothing music and white noise may be helpful if there are background noises you cannot remove.  Stop tedious detailed work at least one hour before bedtime. HOME CARE INSTRUCTIONS   Keep a diary. Inform your caregiver about your progress. This includes any medication side effects. See your caregiver regularly. Take note of:  Times when you are asleep.  Times when you are awake during the night.  The quality of your sleep.  How you feel the next day. This information will help your caregiver care for you.  Get out of bed if you are still awake after 15 minutes. Read or do some quiet activity. Keep the lights down. Wait until you feel sleepy and go back to bed.  Keep regular sleeping and waking hours. Avoid naps.  Exercise regularly.  Avoid distractions at bedtime. Distractions include watching television or engaging in any intense or detailed activity like  attempting to balance the household checkbook.  Develop a bedtime ritual. Keep a familiar routine of bathing, brushing your teeth, climbing into bed at the same time each night, listening to soothing music. Routines increase the success of falling to sleep faster.  Use relaxation techniques.  This can be using breathing and muscle tension release routines. It can also include visualizing peaceful scenes. You can also help control troubling or intruding thoughts by keeping your mind occupied with boring or repetitive thoughts like the old concept of counting sheep. You can make it more creative like imagining planting one beautiful flower after another in your backyard garden.  During your day, work to eliminate stress. When this is not possible use some of the previous suggestions to help reduce the anxiety that accompanies stressful situations. MAKE SURE YOU:   Understand these instructions.  Will watch your condition.  Will get help right away if you are not doing well or get worse. Document Released: 02/13/2000 Document Revised: 05/10/2011 Document Reviewed: 03/15/2007 [facility] Patient Information 2014 [employer].

## 2013-04-06 NOTE — Progress Notes (Signed)
Pre-visit discussion using our clinic review tool. No additional management support is needed unless otherwise documented below in the visit note.  

## 2013-04-06 NOTE — Progress Notes (Signed)
Patient ID: Lisa Lamb, female   DOB: 06/01/1955, 58 y.o.   MRN: [id number]   Patient Active Problem List   Diagnosis Date Noted  . Panic attack as reaction to stress 04/07/2013  . Spider veins 12/13/2012  . Varicose veins of lower extremities with other complications 08/16/2012  . Left upper extremity swelling 07/04/2012  . Cervicalgia 12/20/2011  . Migraine headache with aura 11/29/2011  . Cognitive complaints 11/29/2011  . Migraine headache     Subjective:  CC:   Chief Complaint  Patient presents with  . Headache    medication for headaches follow up  . Follow-up    HPI:   Lisa Lamb is a 58 y.o. female who presents for Concerns about cognitive decline and Headaches.  Has had  chronic migraines since adolescence,  Managed with sumatriptan 50 mg.  She has been averaging  using weekly since the summer due to increase in stressors in her personal life. She notices a depressed mood  A day or two after using it.  States that she has thought about suicide on 2 or 3 occasions since November but has not had a plan or acted on it because she loves her children .      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 states that she had meltdown after bombing a MAC course. The instructor was going too fast and she could not follow along   She feel vastly unqualified and unskilled to work outside of the home . She has been out of the work force 19 years ago except for part time work at the [employer].  wakes up with a kntw in her stomach.,  Has had a few panic attacks, about  Once a week.  Does not want to take a medication that will make her feel numb.    Interested in seeing a functional neurologist tohelp her maximize her brain power and has requested a referall    Past Medical History  Diagnosis Date  . Migraine headache   . DVT (deep venous thrombosis)   . Anemia     Past Surgical History  Procedure  Laterality Date  . Cesarean section  1988       The following portions of the patient's history were reviewed and updated as appropriate: Allergies, current medications, and problem list.    Review of Systems:   Patient denies headache, fevers, malaise, unintentional weight loss, skin rash, eye pain, sinus congestion and sinus pain, sore throat, dysphagia,  hemoptysis , cough, dyspnea, wheezing, chest pain, palpitations, orthopnea, edema, abdominal pain, nausea, melena, diarrhea, constipation, flank pain, dysuria, hematuria, urinary  Frequency, nocturia, numbness, tingling, seizures,  Focal weakness, Loss of consciousness,  Tremor, insomnia, depression, anxiety, and suicidal ideation.     History   Social History  . Marital Status: Married    Spouse Name: N/A    Number of Children: N/A  . Years of Education: N/A   Occupational History  . Not on file.   Social History Main Topics  . Smoking status: Never Smoker   . Smokeless tobacco: Never Used  . Alcohol Use: 1.2 oz/week    2 Glasses of wine per week  . Drug Use: No  . Sexual Activity: Not on file   Other Topics Concern  . Not on file   Social History Narrative  . No narrative on file    Objective:  Filed Vitals:  04/06/13 0952  BP: 108/60  Pulse: 70  Temp: 98.1 F (36.7 C)  Resp: 16     General appearance: alert, cooperative and appears stated age Ears: normal TM's and external ear canals both ears Throat: lips, mucosa, and tongue normal; teeth and gums normal Neck: no adenopathy, no carotid bruit, supple, symmetrical, trachea midline and thyroid not enlarged, symmetric, no tenderness/mass/nodules Back: symmetric, no curvature. ROM normal. No CVA tenderness. Lungs: clear to auscultation bilaterally Heart: regular rate and rhythm, S1, S2 normal, no murmur, click, rub or gallop Abdomen: soft, non-tender; bowel sounds normal; no masses,  no organomegaly Pulses: 2+ and symmetric Skin: Skin color, texture,  turgor normal. No rashes or lesions Lymph nodes: Cervical, supraclavicular, and axillary nodes normal.  Assessment and Plan:  Migraine headache with aura Continue use of Imitrex as needed. We discussed prophylactic/preventive therapy with daily use of Elavil.she does not want to start preventive therapy at this time.  Her MRI last October 2013 was normal. She has no history of hypertension blood clots tobacco history or hyperlipidemia.she is requesting referral to a "functional neurologist" and has a Charity fundraiser  in mind.   Cognitive complaints MMSE was done and her score was 28/30.  Her naming was fluent.  Reassurance provided, referral in process as requested.   Panic attack as reaction to stress Trial of alprazolam prn. She is not interested in daily therapy. Discussed her previous thoughts about suicide  She is not contemplating suicide and has contracted for safety.   A total of 40 minutes was spent with patient more than half of which was spent in counseling, reviewing records from other prviders and coordination of care.  Updated Medication List Outpatient Encounter Prescriptions as of 04/06/2013  Medication Sig  . ondansetron (ZOFRAN-ODT) 4 MG disintegrating tablet 1 or 2 tablets every 8 hours as neded for nausea  . SUMAtriptan (IMITREX) 20 MG/ACT nasal spray Place 1 spray (20 mg total) into the nose every 2 (two) hours as needed for migraine.  . SUMAtriptan (IMITREX) 50 MG tablet take 1 tablet by mouth EVERY 2 HOURS IF NEEDED FOR MIGRAINES.  (MAXIMUM OF 2 DOSES IN A 24 HOUR PERIOD)  . ALPRAZolam (XANAX) 0.5 MG tablet Take 1 tablet (0.5 mg total) by mouth daily as needed for anxiety.  Marland Kitchen doxycycline (VIBRA-TABS) 100 MG tablet Take 1 tablet by mouth 2 (two) times daily.     Orders Placed This Encounter  Procedures  . MM DIGITAL SCREENING BILATERAL  . Tdap vaccine greater than or equal to 7yo IM  . CBC with Differential  . Comprehensive metabolic panel  . TSH  . Vit D   25 hydroxy (rtn osteoporosis monitoring)  . LDL cholesterol, direct  . Ambulatory referral to Neurology    No Follow-up on file.

## 2013-04-07 ENCOUNTER — Encounter: Payer: Self-pay | Admitting: Internal Medicine

## 2013-04-07 DIAGNOSIS — F430 Acute stress reaction: Secondary | ICD-10-CM

## 2013-04-07 DIAGNOSIS — F410 Panic disorder [episodic paroxysmal anxiety] without agoraphobia: Secondary | ICD-10-CM | POA: Insufficient documentation

## 2013-04-07 LAB — VITAMIN D 25 HYDROXY (VIT D DEFICIENCY, FRACTURES): VIT D 25 HYDROXY: 37 ng/mL (ref 30–89)

## 2013-04-07 NOTE — Assessment & Plan Note (Addendum)
Trial of alprazolam prn. She is not interested in daily therapy. Discussed her previous thoughts about suicide  She is not contemplating suicide and has contracted for safety.

## 2013-04-07 NOTE — Assessment & Plan Note (Signed)
MMSE was done and her score was 28/30.  Her naming was fluent.  Reassurance provided, referral in process as requested.

## 2013-04-07 NOTE — Assessment & Plan Note (Signed)
Continue use of Imitrex as needed. We discussed prophylactic/preventive therapy with daily use of Elavil.she does not want to start preventive therapy at this time.  Her MRI last October 2013 was normal. She has no history of hypertension blood clots tobacco history or hyperlipidemia.she is requesting referral to a "functional neurologist" and has a Charity fundraiserparticular practitioner  in mind.

## 2013-04-09 LAB — COMPREHENSIVE METABOLIC PANEL
ALBUMIN: 4.4 g/dL (ref 3.5–5.2)
ALK PHOS: 35 U/L — AB (ref 39–117)
ALT: 17 U/L (ref 0–35)
AST: 29 U/L (ref 0–37)
BILIRUBIN TOTAL: 0.5 mg/dL (ref 0.3–1.2)
BUN: 19 mg/dL (ref 6–23)
CO2: 26 mEq/L (ref 19–32)
Calcium: 9.5 mg/dL (ref 8.4–10.5)
Chloride: 105 mEq/L (ref 96–112)
Creatinine, Ser: 1 mg/dL (ref 0.4–1.2)
GFR: 59.38 mL/min — ABNORMAL LOW (ref >60.00)
GLUCOSE: 77 mg/dL (ref 70–99)
POTASSIUM: 4.1 meq/L (ref 3.5–5.1)
Sodium: 139 mEq/L (ref 135–145)
Total Protein: 7.4 g/dL (ref 6.0–8.3)

## 2013-04-09 LAB — LDL CHOLESTEROL, DIRECT: LDL DIRECT: 88.6 mg/dL

## 2013-04-10 ENCOUNTER — Encounter: Payer: Self-pay | Admitting: *Deleted

## 2013-05-29 ENCOUNTER — Telehealth: Payer: Self-pay | Admitting: Internal Medicine

## 2013-05-29 NOTE — Telephone Encounter (Signed)
FYI-pt declined evaluation in ED today, has appt with Dr. Darrick Huntsmanullo tomorrow morning

## 2013-05-29 NOTE — Telephone Encounter (Signed)
Patient Information:  Caller Name: Darl PikesSusan  Phone: [phone]  Patient: Lisa Lamb, Lisa Lamb  Gender: Female  DOB: 01/23/1956  Age: 58 Years  PCP: Duncan Dullullo, Teresa (Adults only)  Office Follow Up:  Does the office need to follow up with this patient?: No  Instructions For The Office: N/A  RN Note:  Spoke to nurse Becky at the office--she advised to go to ED. Triaged patient per [facility] protocol: QMV:HQIOEye:Pain or Vision Change and arrived at disposition: See ED Immediately. Advised pt to go to ED but she refused stating that she feels fine and has an appt at the office in am. Advised pt to call back or go to ED if she has any worsening sxs.  Symptoms  Reason For Call & Symptoms: Calling about blurred vision in right eye, also has a spot and wavy lines in front when looking straight ahead but peripheral vision is good, heart is racing and anxious. Has an appt scheduled at the office tomorrow morning.  Reviewed Health History In EMR: Yes  Reviewed Medications In EMR: Yes  Reviewed Allergies In EMR: Yes  Reviewed Surgeries / Procedures: Yes  Date of Onset of Symptoms: 05/27/2013  Guideline(s) Used:  No Protocol Available - Sick Adult  Disposition Per Guideline:   Go to ED Now  Reason For Disposition Reached:   Nursing judgment  Advice Given:  N/A  Patient Will Follow Care Advice:  YES

## 2013-05-30 ENCOUNTER — Ambulatory Visit: Payer: Self-pay | Admitting: Internal Medicine

## 2013-05-30 ENCOUNTER — Encounter: Payer: Self-pay | Admitting: Internal Medicine

## 2013-05-30 ENCOUNTER — Ambulatory Visit (INDEPENDENT_AMBULATORY_CARE_PROVIDER_SITE_OTHER): Payer: BC Managed Care – PPO | Admitting: Internal Medicine

## 2013-05-30 VITALS — BP 102/58 | HR 66 | Temp 98.0°F | Resp 18 | Wt 109.0 lb

## 2013-05-30 DIAGNOSIS — F410 Panic disorder [episodic paroxysmal anxiety] without agoraphobia: Secondary | ICD-10-CM

## 2013-05-30 DIAGNOSIS — F430 Acute stress reaction: Secondary | ICD-10-CM

## 2013-05-30 DIAGNOSIS — M79609 Pain in unspecified limb: Secondary | ICD-10-CM

## 2013-05-30 DIAGNOSIS — R040 Epistaxis: Secondary | ICD-10-CM

## 2013-05-30 DIAGNOSIS — R4589 Other symptoms and signs involving emotional state: Secondary | ICD-10-CM

## 2013-05-30 DIAGNOSIS — G43109 Migraine with aura, not intractable, without status migrainosus: Secondary | ICD-10-CM

## 2013-05-30 DIAGNOSIS — H539 Unspecified visual disturbance: Secondary | ICD-10-CM

## 2013-05-30 DIAGNOSIS — M19049 Primary osteoarthritis, unspecified hand: Secondary | ICD-10-CM

## 2013-05-30 DIAGNOSIS — M79644 Pain in right finger(s): Secondary | ICD-10-CM

## 2013-05-30 NOTE — Patient Instructions (Signed)
X ray of index finger right hand  Your eye exam was normal,  But I recommend having a formal eye exam with testing for glaucoma at [facility]  Continue prn use of low dose alprazolam. Call if you decide you need a daily medication for your anxiety

## 2013-05-30 NOTE — Progress Notes (Signed)
Pre-visit discussion using our clinic review tool. No additional management support is needed unless otherwise documented below in the visit note.  

## 2013-05-30 NOTE — Progress Notes (Signed)
Patient ID: Lisa Lamb, female   DOB: [birth year], 58 y.o.   MRN: [id number]   Patient Active Problem List   Diagnosis Date Noted  . Visual changes 06/01/2013  . OA (osteoarthritis) of finger 05/31/2013  . Panic attack as reaction to stress 04/07/2013  . Spider veins 12/13/2012  . Varicose veins of lower extremities with other complications 08/16/2012  . Cervicalgia 12/20/2011  . Migraine headache with aura 11/29/2011  . Cognitive complaints 11/29/2011  . Migraine headache     Subjective:  CC:   Chief Complaint  Patient presents with  . Acute Visit    blurred vision C 4days in right eye,  Second phalange on right hand swollen    HPI:   Lisa Lamb is a 58 y.o. female who presents for  History of Trauma to first finger 2 months ago during a fall.  Has had swollen PIP since then   Treated with injection for a trigger finger by a hand specialist 1 year ago,    Blurred vision .  Having extreme stress, and during an episode developed blurred vision in right eye following by seeing flashing lights behind closed eyelids.  Describes the phenomenom as similar  To the aura during her migraines, but has not had a migraine in a while.   No eye pain now but has felt pressure at the time.  Vision still blurred bur able to read.   Also been having blood streaked nasal discharge with clots which started on Monday.  Has been Intermittent for the  last couple of days when she blows her nose.  No sinus pain or signs of infection ,  No headaches  Last use of aspirin was on on Sunday night 650 mg     Past Medical History  Diagnosis Date  . Migraine headache   . DVT (deep venous thrombosis)   . Anemia     Past Surgical History  Procedure Laterality Date  . Cesarean section  1988       The following portions of the patient's history were reviewed and updated as appropriate: Allergies, current medications, and problem list.    Review of Systems:   Patient denies headache, fevers,  malaise, unintentional weight loss, skin rash, eye pain, sinus congestion and sinus pain, sore throat, dysphagia,  hemoptysis , cough, dyspnea, wheezing, chest pain, palpitations, orthopnea, edema, abdominal pain, nausea, melena, diarrhea, constipation, flank pain, dysuria, hematuria, urinary  Frequency, nocturia, numbness, tingling, seizures,  Focal weakness, Loss of consciousness,  Tremor, insomnia, depression, anxiety, and suicidal ideation.     History   Social History  . Marital Status: Married    Spouse Name: N/A    Number of Children: N/A  . Years of Education: N/A   Occupational History  . Not on file.   Social History Main Topics  . Smoking status: Never Smoker   . Smokeless tobacco: Never Used  . Alcohol Use: 1.2 oz/week    2 Glasses of wine per week  . Drug Use: No  . Sexual Activity: Not on file   Other Topics Concern  . Not on file   Social History Narrative  . No narrative on file    Objective:  Filed Vitals:   05/30/13 1031  BP: 102/58  Pulse: 66  Temp: 98 F (36.7 C)  Resp: 18     General appearance: alert, cooperative and appears stated age Ears: normal TM's and external ear canals both ears Throat: lips, mucosa, and tongue normal; teeth  and gums normal Neck: no adenopathy, no carotid bruit, supple, symmetrical, trachea midline and thyroid not enlarged, symmetric, no tenderness/mass/nodules Back: symmetric, no curvature. ROM normal. No CVA tenderness. Lungs: clear to auscultation bilaterally Heart: regular rate and rhythm, S1, S2 normal, no murmur, click, rub or gallop Abdomen: soft, non-tender; bowel sounds normal; no masses,  no organomegaly Pulses: 2+ and symmetric Skin: Skin color, texture, turgor normal. No rashes or lesions Lymph nodes: Cervical, supraclavicular, and axillary nodes normal.  Assessment and Plan:  Migraine headache with aura Increased frequency reported in February due to stressors,  But none in the past several weeks. No  changes today .   OA (osteoarthritis) of finger Confirmed with plain films,  No indication for orthopedic evaluation at this tinme.  Prn NSAIDs. s.    Panic attack as reaction to stress Continue  prn use of alprazolam for infrequent attacks.   Visual changes Neurologic and fundoscopic exam normal today. Referral to [facility] for formal evaluation.  Normal MRI brain Oct 2013     Updated Medication List Outpatient Encounter Prescriptions as of 05/30/2013  Medication Sig  . ALPRAZolam (XANAX) 0.5 MG tablet Take 1 tablet (0.5 mg total) by mouth daily as needed for anxiety.  . ondansetron (ZOFRAN-ODT) 4 MG disintegrating tablet 1 or 2 tablets every 8 hours as neded for nausea  . SUMAtriptan (IMITREX) 20 MG/ACT nasal spray Place 1 spray (20 mg total) into the nose every 2 (two) hours as needed for migraine.  . SUMAtriptan (IMITREX) 50 MG tablet take 1 tablet by mouth EVERY 2 HOURS IF NEEDED FOR MIGRAINES.  (MAXIMUM OF 2 DOSES IN A 24 HOUR PERIOD)  . doxycycline (VIBRA-TABS) 100 MG tablet Take 1 tablet by mouth 2 (two) times daily.     Orders Placed This Encounter  Procedures  . DG Finger Index Right    No Follow-up on file.

## 2013-05-31 ENCOUNTER — Telehealth: Payer: Self-pay | Admitting: Internal Medicine

## 2013-05-31 DIAGNOSIS — M19049 Primary osteoarthritis, unspecified hand: Secondary | ICD-10-CM

## 2013-05-31 NOTE — Telephone Encounter (Signed)
Left message for patient to call office.  

## 2013-05-31 NOTE — Telephone Encounter (Signed)
The x ray of rhe index finger showed moderate arthritis of the joint,  No fractures or erosions to suggest anthing other than plain old arhtritis

## 2013-06-01 ENCOUNTER — Encounter: Payer: Self-pay | Admitting: Internal Medicine

## 2013-06-01 DIAGNOSIS — H539 Unspecified visual disturbance: Secondary | ICD-10-CM | POA: Insufficient documentation

## 2013-06-01 DIAGNOSIS — R040 Epistaxis: Secondary | ICD-10-CM | POA: Insufficient documentation

## 2013-06-01 NOTE — Assessment & Plan Note (Addendum)
Confirmed with plain films,  No indication for orthopedic evaluation at this tinme.  Prn NSAIDs. s.

## 2013-06-01 NOTE — Assessment & Plan Note (Signed)
Continue  prn use of alprazolam for infrequent attacks.

## 2013-06-01 NOTE — Assessment & Plan Note (Signed)
Neurologic and fundoscopic exam normal today. Referral to [facility] for formal evaluation

## 2013-06-01 NOTE — Assessment & Plan Note (Signed)
Increased frequency reported in February due to stressors,  But none in the past several weeks. No changes today .

## 2013-06-01 NOTE — Assessment & Plan Note (Signed)
In the absence of headaches and facial pain, with normal HEENT exam,  Cause is likely benign process..  Recommended saline misting and use of vaseline, avoidance of excessive aspirin products.

## 2013-06-04 NOTE — Telephone Encounter (Signed)
Not able to reach patient left message with family member to have patient call office,

## 2013-06-05 ENCOUNTER — Encounter: Payer: Self-pay | Admitting: *Deleted

## 2013-06-05 NOTE — Telephone Encounter (Signed)
Letter mailed unable to reach patient by phone. 

## 2013-07-10 ENCOUNTER — Other Ambulatory Visit: Payer: Self-pay | Admitting: Internal Medicine

## 2013-07-10 NOTE — Telephone Encounter (Signed)
Ok refill? 

## 2013-07-24 ENCOUNTER — Encounter: Payer: Self-pay | Admitting: Internal Medicine

## 2013-09-24 ENCOUNTER — Ambulatory Visit (INDEPENDENT_AMBULATORY_CARE_PROVIDER_SITE_OTHER): Payer: BC Managed Care – PPO | Admitting: Internal Medicine

## 2013-09-24 ENCOUNTER — Encounter: Payer: Self-pay | Admitting: Internal Medicine

## 2013-09-24 VITALS — BP 108/60 | HR 76 | Temp 98.6°F | Resp 18 | Ht 65.5 in | Wt 100.0 lb

## 2013-09-24 DIAGNOSIS — R636 Underweight: Secondary | ICD-10-CM

## 2013-09-24 DIAGNOSIS — F5102 Adjustment insomnia: Secondary | ICD-10-CM

## 2013-09-24 DIAGNOSIS — G4700 Insomnia, unspecified: Secondary | ICD-10-CM

## 2013-09-24 DIAGNOSIS — R634 Abnormal weight loss: Secondary | ICD-10-CM

## 2013-09-24 DIAGNOSIS — F411 Generalized anxiety disorder: Secondary | ICD-10-CM | POA: Insufficient documentation

## 2013-09-24 MED ORDER — MIRTAZAPINE 7.5 MG PO TABS: 7.5000 mg | ORAL_TABLET | Freq: Every day | ORAL | Status: DC

## 2013-09-24 NOTE — Patient Instructions (Addendum)
I am starting you on mirtazapine to help you rest.  This is a medication that treats multiple symptoms   Take the dose  7.5 mg  at  9 pm.  If still awake at 11 pm ,  Take one alprazolam   Follow up with me in 2 weeks via Mychart  Here are the names of several well respected female therapists   Montel ClockKatherine Wagner    [phone]  [place] Karen [place]   [phone]  [place] Valerie HectorPadgett [phone]  [place] Jane Perrin  [phone]  Judithann SheenWhitsett

## 2013-09-24 NOTE — Progress Notes (Signed)
Pre-visit discussion using our clinic review tool. No additional management support is needed unless otherwise documented below in the visit note.  

## 2013-09-24 NOTE — Progress Notes (Signed)
Patient ID: Lisa MuslimSusan C Pennella, female   DOB: 03/25/1955, 58 y.o.   MRN: [id number]   Patient Active Problem List   Diagnosis Date Noted  . Insomnia due to psychological stress 09/25/2013  . Underweight 09/25/2013  . Generalized anxiety disorder 09/24/2013  . Visual changes 06/01/2013  . Epistaxis 06/01/2013  . OA (osteoarthritis) of finger 05/31/2013  . Panic attack as reaction to stress 04/07/2013  . Spider veins 12/13/2012  . Varicose veins of lower extremities with other complications 08/16/2012  . Cervicalgia 12/20/2011  . Migraine headache with aura 11/29/2011  . Cognitive complaints 11/29/2011  . Migraine headache     Subjective:  CC:   Chief Complaint  Patient presents with  . Anxiety    Patient stated feels the anxiety has gradually becoming worse and is at a plateau    HPI:   Lisa Lamb is a 58 y.o. female who presents for  Worsening anxiety.  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   In the past she has used herbal remedies to manage her insomnia . Vic RipperFir the past month she has tried various remedies including  1/2 xanax at night to help her sleep along with 50 mg benadryl at 6 pm, which left her feeling sedated well into the morning during waking hours.    She has lost 9 lbs since April due to anorexia, and is now underweight.   Past Medical History  Diagnosis Date  . Migraine headache   . DVT (deep venous thrombosis)   . Anemia     Past Surgical History  Procedure Laterality Date  . Cesarean section  1988       The following portions of the patient's history were reviewed and updated as appropriate: Allergies, current medications, and problem list.    Review of Systems:   Patient denies headache, fevers, malaise, unintentional weight loss, skin rash, eye pain, sinus congestion and  sinus pain, sore throat, dysphagia,  hemoptysis , cough, dyspnea, wheezing, chest pain, palpitations, orthopnea, edema, abdominal pain, nausea, melena, diarrhea, constipation, flank pain, dysuria, hematuria, urinary  Frequency, nocturia, numbness, tingling, seizures,  Focal weakness, Loss of consciousness,  Tremor, insomnia, depression, anxiety, and suicidal ideation.     History   Social History  . Marital Status: Married    Spouse Name: N/A    Number of Children: N/A  . Years of Education: N/A   Occupational History  . Not on file.   Social History Main Topics  . Smoking status: Never Smoker   . Smokeless tobacco: Never Used  . Alcohol Use: 1.2 oz/week    2 Glasses of wine per week  . Drug Use: No  . Sexual Activity: Not Currently   Other Topics Concern  . Not on file   Social History Narrative  . No narrative on file    Objective:  Filed Vitals:   09/24/13 1356  BP: 108/60  Pulse: 76  Temp: 98.6 F (37 C)  Resp: 18     General appearance: alert, cooperative and appears stated age Ears: normal TM's and external ear canals both ears Throat: lips, mucosa, and tongue normal; teeth and gums normal Neck: no adenopathy, no carotid bruit, supple, symmetrical, trachea midline and thyroid not enlarged, symmetric, no tenderness/mass/nodules Back: symmetric, no curvature. ROM normal. No CVA tenderness.  Lungs: clear to auscultation bilaterally Heart: regular rate and rhythm, S1, S2 normal, no murmur, click, rub or gallop Abdomen: soft, non-tender; bowel sounds normal; no masses,  no organomegaly Pulses: 2+ and symmetric Skin: Skin color, texture, turgor normal. No rashes or lesions Lymph nodes: Cervical, supraclavicular, and axillary nodes normal.  Assessment and Plan:  Generalized anxiety disorder Accompanied by insomnia, wt loss , and grief .  Trial or mirtazipine to improve appetite, insomnia and energy level. Prn alprazolam.  Patient was given names of several well  respected psychotherapists to contact for talk therapy.   Underweight Secondary to grief and anxiety.  Thyroid function was normal in February. Trial of remeron.     Updated Medication List Outpatient Encounter Prescriptions as of 09/24/2013  Medication Sig  . ALPRAZolam (XANAX) 0.5 MG tablet Take 1 tablet (0.5 mg total) by mouth daily as needed for anxiety.  . ondansetron (ZOFRAN-ODT) 4 MG disintegrating tablet dissolve 1 or 2 tablets ON TONGUE every 8 hours if needed for nausea  . SUMAtriptan (IMITREX) 20 MG/ACT nasal spray Place 1 spray (20 mg total) into the nose every 2 (two) hours as needed for migraine.  . SUMAtriptan (IMITREX) 50 MG tablet take 1 tablet by mouth EVERY 2 HOURS IF NEEDED FOR MIGRAINES.  (MAXIMUM OF 2 DOSES IN A 24 HOUR PERIOD)  . mirtazapine (REMERON) 7.5 MG tablet Take 1 tablet (7.5 mg total) by mouth at bedtime.  . [DISCONTINUED] doxycycline (VIBRA-TABS) 100 MG tablet Take 1 tablet by mouth 2 (two) times daily.     No orders of the defined types were placed in this encounter.    No Follow-up on file.

## 2013-09-25 ENCOUNTER — Encounter: Payer: Self-pay | Admitting: Internal Medicine

## 2013-09-25 DIAGNOSIS — R636 Underweight: Secondary | ICD-10-CM | POA: Insufficient documentation

## 2013-09-25 DIAGNOSIS — F5102 Adjustment insomnia: Secondary | ICD-10-CM | POA: Insufficient documentation

## 2013-09-25 NOTE — Assessment & Plan Note (Addendum)
Accompanied by insomnia, wt loss , and grief .  Trial or mirtazipine to improve appetite, insomnia and energy level. Prn alprazolam.  Patient was given names of several well respected psychotherapists to contact for talk therapy.

## 2013-09-25 NOTE — Assessment & Plan Note (Addendum)
Secondary to grief and anxiety.  Thyroid function was normal in February. Trial of remeron.

## 2013-10-24 ENCOUNTER — Other Ambulatory Visit: Payer: Self-pay | Admitting: Internal Medicine

## 2013-10-24 NOTE — Telephone Encounter (Signed)
Ok to refill,  printed rx  

## 2013-10-24 NOTE — Telephone Encounter (Signed)
Last reill 5.6.15, last OV 7.27.15.  Please advise refill.

## 2014-01-07 ENCOUNTER — Other Ambulatory Visit: Payer: Self-pay | Admitting: Internal Medicine

## 2014-01-28 ENCOUNTER — Telehealth: Payer: Self-pay

## 2014-01-28 NOTE — Telephone Encounter (Signed)
We generally do not change between providers.

## 2014-01-28 NOTE — Telephone Encounter (Signed)
The patient called hoping to switch from Dr.Tullo to Dr.Walker.  She did not state why.  Do you want this switch to go through?

## 2014-01-28 NOTE — Telephone Encounter (Signed)
Pretty sure we have a policy in place that does not allow patients to switch providers within the practice.  You can check with Dr Dan HumphreysWalker

## 2014-02-01 ENCOUNTER — Other Ambulatory Visit: Payer: Self-pay | Admitting: Internal Medicine

## 2014-02-01 ENCOUNTER — Other Ambulatory Visit: Payer: Self-pay | Admitting: *Deleted

## 2014-02-06 ENCOUNTER — Telehealth: Payer: Self-pay | Admitting: Internal Medicine

## 2014-02-06 NOTE — Telephone Encounter (Signed)
Pt called to request appt to get sleep meds. Please advise where to add pt/msn

## 2014-02-06 NOTE — Telephone Encounter (Signed)
First available not acute.

## 2014-02-27 ENCOUNTER — Other Ambulatory Visit: Payer: Self-pay | Admitting: Internal Medicine

## 2014-03-20 ENCOUNTER — Ambulatory Visit (INDEPENDENT_AMBULATORY_CARE_PROVIDER_SITE_OTHER): Payer: BLUE CROSS/BLUE SHIELD | Admitting: Internal Medicine

## 2014-03-20 ENCOUNTER — Encounter: Payer: Self-pay | Admitting: Internal Medicine

## 2014-03-20 VITALS — BP 122/64 | HR 69 | Temp 98.4°F | Resp 16 | Ht 66.0 in | Wt 107.2 lb

## 2014-03-20 DIAGNOSIS — F5102 Adjustment insomnia: Secondary | ICD-10-CM

## 2014-03-20 DIAGNOSIS — G43109 Migraine with aura, not intractable, without status migrainosus: Secondary | ICD-10-CM

## 2014-03-20 DIAGNOSIS — R419 Unspecified symptoms and signs involving cognitive functions and awareness: Secondary | ICD-10-CM

## 2014-03-20 MED ORDER — TRAZODONE HCL 50 MG PO TABS: 25.0000 mg | ORAL_TABLET | Freq: Every evening | ORAL | Status: DC | PRN

## 2014-03-20 MED ORDER — SUMATRIPTAN SUCCINATE 100 MG PO TABS: 100.0000 mg | ORAL_TABLET | Freq: Once | ORAL | Status: DC

## 2014-03-20 NOTE — Progress Notes (Signed)
Pre-visit discussion using our clinic review tool. No additional management support is needed unless otherwise documented below in the visit note.  

## 2014-03-20 NOTE — Progress Notes (Signed)
Patient ID: Lisa MuslimSusan C Lamb, female   DOB: 02/12/1956, 59 y.o.   MRN: [id number]  Patient Active Problem List   Diagnosis Date Noted  . Insomnia due to psychological stress 09/25/2013  . Underweight 09/25/2013  . Generalized anxiety disorder 09/24/2013  . Visual changes 06/01/2013  . Epistaxis 06/01/2013  . OA (osteoarthritis) of finger 05/31/2013  . Panic attack as reaction to stress 04/07/2013  . Spider veins 12/13/2012  . Varicose veins of lower extremities with other complications 08/16/2012  . Cervicalgia 12/20/2011  . Migraine headache with aura 11/29/2011  . Cognitive complaints 11/29/2011  . Migraine headache     Subjective:  CC:   Chief Complaint  Patient presents with  . Follow-up    medication refills    HPI:   Lisa MuslimSusan C Lamb is a 59 y.o. female who presents for Follow up on generalized anxiety,  fatigue and impaired concentration.  She was last seen in July .  She reports persistent difficulty concentrating, and persistent insomnia despite trial of remeron.. Which she discontinued.  She has gained weight but is not exercising regularly  And continues to report anhedonia and pessimistic outlook at times since her separation from husband,  No suicidality   In July 2015:   She presented with Worsening anxiety.  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   In the past she has used herbal remedies to manage her insomnia . Vic RipperFir the past month she has tried various remedies including  1/2 xanax at night to help her sleep along with 50 mg benadryl at 6 pm, which left her feeling sedated well into the morning during waking hours.     Past Medical History  Diagnosis Date  . Migraine headache   . DVT (deep venous thrombosis)   . Anemia     Past Surgical History  Procedure Laterality Date  . Cesarean  section  1988       The following portions of the patient's history were reviewed and updated as appropriate: Allergies, current medications, and problem list.    Review of Systems:   Patient denies headache, fevers, malaise, unintentional weight loss, skin rash, eye pain, sinus congestion and sinus pain, sore throat, dysphagia,  hemoptysis , cough, dyspnea, wheezing, chest pain, palpitations, orthopnea, edema, abdominal pain, nausea, melena, diarrhea, constipation, flank pain, dysuria, hematuria, urinary  Frequency, nocturia, numbness, tingling, seizures,  Focal weakness, Loss of consciousness,  Tremor, insomnia, depression, anxiety, and suicidal ideation.     History   Social History  . Marital Status: Married    Spouse Name: N/A    Number of Children: N/A  . Years of Education: N/A   Occupational History  . Not on file.   Social History Main Topics  . Smoking status: Never Smoker   . Smokeless tobacco: Never Used  . Alcohol Use: 1.2 oz/week    2 Glasses of wine per week  . Drug Use: No  . Sexual Activity: Not Currently   Other Topics Concern  . Not on file   Social History Narrative    Objective:  Filed Vitals:   03/20/14 1446  BP: 122/64  Pulse: 69  Temp: 98.4 F (36.9 C)  Resp: 16     General appearance: alert, cooperative and appears stated age Ears: normal TM's and external ear canals  both ears Throat: lips, mucosa, and tongue normal; teeth and gums normal Neck: no adenopathy, no carotid bruit, supple, symmetrical, trachea midline and thyroid not enlarged, symmetric, no tenderness/mass/nodules Back: symmetric, no curvature. ROM normal. No CVA tenderness. Lungs: clear to auscultation bilaterally Heart: regular rate and rhythm, S1, S2 normal, no murmur, click, rub or gallop Abdomen: soft, non-tender; bowel sounds normal; no masses,  no organomegaly Pulses: 2+ and symmetric Skin: Skin color, texture, turgor normal. No rashes or lesions Lymph nodes:  Cervical, supraclavicular, and axillary nodes normal.  Assessment and Plan:  Insomnia due to psychological stress Trial of trazodone starting with 25 mg one hour before bedtime. The risks and benefits of benzodiazepine use were discussed with patient today including excessive sedation leading to respiratory depression,  impaired thinking/driving, and addiction.  Patient was advised to avoid concurrent use with alcohol, to use her alprazolam  only as needed and not to share with others  .    Cognitive complaints Discussed trial of wellbutrin if improvement in sleep did not improve cognitive complaints.    Migraine headache with aura Averaging 4 or 5 per month, using imitrex up to 2 at a time..  Dose increased to 100 mg    A total of 30 minutes of face to face time was spent with patient more than half of which was spent in counselling and coordination of care   Updated Medication List Outpatient Encounter Prescriptions as of 03/20/2014  Medication Sig  . ALPRAZolam (XANAX) 0.5 MG tablet take 1 tablet by mouth once daily if needed for anxiety  . ondansetron (ZOFRAN-ODT) 4 MG disintegrating tablet dissolve 1 or 2 tablets ON TONGUE every 8 hours if needed for nausea  . [DISCONTINUED] SUMAtriptan (IMITREX) 20 MG/ACT nasal spray Place 1 spray (20 mg total) into the nose every 2 (two) hours as needed for migraine.  . [DISCONTINUED] SUMAtriptan (IMITREX) 50 MG tablet take 1 tablet by mouth immediately may repeat after 2 hours  . SUMAtriptan (IMITREX) 100 MG tablet Take 1 tablet (100 mg total) by mouth once. May repeat in 2 hours if headache persists or recurs.  . traZODone (DESYREL) 50 MG tablet Take 0.5-1 tablets (25-50 mg total) by mouth at bedtime as needed for sleep.  . [DISCONTINUED] mirtazapine (REMERON) 7.5 MG tablet Take 1 tablet (7.5 mg total) by mouth at bedtime. (Patient not taking: Reported on 03/20/2014)     No orders of the defined types were placed in this encounter.    No  Follow-up on file.

## 2014-03-20 NOTE — Patient Instructions (Signed)
I'm glad you are feeling better  i have prescribed trazodone for insomnia.  Start with 1/2 talbet 1 hour before intended bedtime  It is NOT A benzodiazepine like  Alprazolam, so it will NOT HELP  PANIC ATTACKS  I recommend putting yourself on a schedule that includes daily morning exercise to help get you started  If you still have trouble focusing ,  We can start wellbutrin to help your mood and your concentration  I have increased the imitrex to 100 mg dose to help you resolve your headaches   Bupropion tablets (Depression/Mood Disorders) What is this medicine? BUPROPION (byoo PROE pee on) is used to treat depression. This medicine may be used for other purposes; ask your health care provider or pharmacist if you have questions. COMMON BRAND NAME(S): Wellbutrin What should I tell my health care provider before I take this medicine? They need to know if you have any of these conditions: -an eating disorder, such as anorexia or bulimia -bipolar disorder or psychosis -diabetes or high blood sugar, treated with medication -glaucoma -heart disease, previous heart attack, or irregular heart beat -head injury or brain tumor -high blood pressure -kidney or liver disease -seizures -suicidal thoughts or a previous suicide attempt -Tourette's syndrome -weight loss -an unusual or allergic reaction to bupropion, other medicines, foods, dyes, or preservatives -breast-feeding -pregnant or trying to become pregnant How should I use this medicine? Take this medicine by mouth with a glass of water. Follow the directions on the prescription label. You can take it with or without food. If it upsets your stomach, take it with food. Take your medicine at regular intervals. Do not take your medicine more often than directed. Do not stop taking this medicine suddenly except upon the advice of your doctor. Stopping this medicine too quickly may cause serious side effects or your condition may worsen. A  special MedGuide will be given to you by the pharmacist with each prescription and refill. Be sure to read this information carefully each time. Talk to your pediatrician regarding the use of this medicine in children. Special care may be needed. Overdosage: If you think you have taken too much of this medicine contact a poison control center or emergency room at once. NOTE: This medicine is only for you. Do not share this medicine with others. What if I miss a dose? If you miss a dose, take it as soon as you can. If it is less than four hours to your next dose, take only that dose and skip the missed dose. Do not take double or extra doses. What may interact with this medicine? Do not take this medicine with any of the following medications: -linezolid -MAOIs like Azilect, Carbex, Eldepryl, Marplan, Nardil, and Parnate -methylene blue (injected into a vein) -other medicines that contain bupropion like Zyban This medicine may also interact with the following medications: -alcohol -certain medicines for anxiety or sleep -certain medicines for blood pressure like metoprolol, propranolol -certain medicines for depression or psychotic disturbances -certain medicines for HIV or AIDS like efavirenz, lopinavir, nelfinavir, ritonavir -certain medicines for irregular heart beat like propafenone, flecainide -certain medicines for Parkinson's disease like amantadine, levodopa -certain medicines for seizures like carbamazepine, phenytoin, phenobarbital -cimetidine -clopidogrel -cyclophosphamide -furazolidone -isoniazid -nicotine -orphenadrine -procarbazine -steroid medicines like prednisone or cortisone -stimulant medicines for attention disorders, weight loss, or to stay awake -tamoxifen -theophylline -thiotepa -ticlopidine -tramadol -warfarin This list may not describe all possible interactions. Give your health care provider a list of all the medicines,  herbs, non-prescription drugs, or  dietary supplements you use. Also tell them if you smoke, drink alcohol, or use illegal drugs. Some items may interact with your medicine. What should I watch for while using this medicine? Tell your doctor if your symptoms do not get better or if they get worse. Visit your doctor or health care professional for regular checks on your progress. Because it may take several weeks to see the full effects of this medicine, it is important to continue your treatment as prescribed by your doctor. Patients and their families should watch out for new or worsening thoughts of suicide or depression. Also watch out for sudden changes in feelings such as feeling anxious, agitated, panicky, irritable, hostile, aggressive, impulsive, severely restless, overly excited and hyperactive, or not being able to sleep. If this happens, especially at the beginning of treatment or after a change in dose, call your health care professional. Avoid alcoholic drinks while taking this medicine. Drinking excessive alcoholic beverages, using sleeping or anxiety medicines, or quickly stopping the use of these agents while taking this medicine may increase your risk for a seizure. Do not drive or use heavy machinery until you know how this medicine affects you. This medicine can impair your ability to perform these tasks. Do not take this medicine close to bedtime. It may prevent you from sleeping. Your mouth may get dry. Chewing sugarless gum or sucking hard candy, and drinking plenty of water may help. Contact your doctor if the problem does not go away or is severe. What side effects may I notice from receiving this medicine? Side effects that you should report to your doctor or health care professional as soon as possible: -allergic reactions like skin rash, itching or hives, swelling of the face, lips, or tongue -breathing problems -changes in vision -confusion -fast or irregular heartbeat -hallucinations -increased blood  pressure -redness, blistering, peeling or loosening of the skin, including inside the mouth -seizures -suicidal thoughts or other mood changes -unusually weak or tired -vomiting Side effects that usually do not require medical attention (report to your doctor or health care professional if they continue or are bothersome): -change in sex drive or performance -constipation -headache -loss of appetite -nausea -tremors -weight loss This list may not describe all possible side effects. Call your doctor for medical advice about side effects. You may report side effects to FDA at 1-800-FDA-1088. Where should I keep my medicine? Keep out of the reach of children. Store at room temperature between 15 and 25 degrees C (59 and 77 degrees F), away from direct sunlight and moisture. Keep tightly closed. Throw away any unused medicine after the expiration date. NOTE: This sheet is a summary. It may not cover all possible information. If you have questions about this medicine, talk to your doctor, pharmacist, or health care provider.  2015, Elsevier/Gold Standard. (2012-09-08 12:42:42)

## 2014-03-23 ENCOUNTER — Encounter: Payer: Self-pay | Admitting: Internal Medicine

## 2014-03-23 NOTE — Assessment & Plan Note (Signed)
Discussed trial of wellbutrin if improvement in sleep did not improve cognitive complaints.

## 2014-03-23 NOTE — Assessment & Plan Note (Signed)
Trial of trazodone starting with 25 mg one hour before bedtime. The risks and benefits of benzodiazepine use were discussed with patient today including excessive sedation leading to respiratory depression,  impaired thinking/driving, and addiction.  Patient was advised to avoid concurrent use with alcohol, to use her alprazolam  only as needed and not to share with others  .

## 2014-03-23 NOTE — Assessment & Plan Note (Signed)
Averaging 4 or 5 per month, using imitrex up to 2 at a time..  Dose increased to 100 mg

## 2014-05-21 ENCOUNTER — Encounter: Payer: Self-pay | Admitting: Vascular Surgery

## 2014-05-22 ENCOUNTER — Ambulatory Visit (INDEPENDENT_AMBULATORY_CARE_PROVIDER_SITE_OTHER): Payer: BLUE CROSS/BLUE SHIELD | Admitting: *Deleted

## 2014-05-22 DIAGNOSIS — I8393 Asymptomatic varicose veins of bilateral lower extremities: Secondary | ICD-10-CM

## 2014-05-22 NOTE — Progress Notes (Signed)
   X=.3% Sotradecol administered with a 27g butterfly.  Patient received a total of 6cc.  Treatted all Icould with one syringe on the front of her legs (not thighs). She will return in May for more work. Tol well. Easy access. Anticipate good results.  Photos: No.(Camera malfunction)  Compression stockings applied: Yes.  and aces because she is thin

## 2014-06-05 ENCOUNTER — Ambulatory Visit (INDEPENDENT_AMBULATORY_CARE_PROVIDER_SITE_OTHER): Payer: BLUE CROSS/BLUE SHIELD | Admitting: *Deleted

## 2014-06-05 DIAGNOSIS — I8393 Asymptomatic varicose veins of bilateral lower extremities: Secondary | ICD-10-CM

## 2014-06-05 NOTE — Progress Notes (Signed)
X=.3% Sotradecol administered with a 27g butterfly.  Patient received a total of 6cc.  Treated any remaining vessels I saw. Ones done a few weeks ago are healing well. Easy access. Tol well. Follow prn.  Photos: No.  Compression stockings applied: Yes.

## 2014-06-17 ENCOUNTER — Other Ambulatory Visit: Payer: Self-pay | Admitting: Internal Medicine

## 2014-07-03 ENCOUNTER — Ambulatory Visit: Payer: BLUE CROSS/BLUE SHIELD | Admitting: *Deleted

## 2014-07-03 ENCOUNTER — Ambulatory Visit: Payer: BLUE CROSS/BLUE SHIELD

## 2014-07-05 ENCOUNTER — Ambulatory Visit (INDEPENDENT_AMBULATORY_CARE_PROVIDER_SITE_OTHER): Payer: BLUE CROSS/BLUE SHIELD | Admitting: Internal Medicine

## 2014-07-05 ENCOUNTER — Encounter: Payer: Self-pay | Admitting: Internal Medicine

## 2014-07-05 VITALS — BP 108/64 | HR 73 | Temp 98.9°F | Resp 14 | Ht 66.0 in | Wt 100.0 lb

## 2014-07-05 DIAGNOSIS — F411 Generalized anxiety disorder: Secondary | ICD-10-CM

## 2014-07-05 MED ORDER — CLONAZEPAM 0.5 MG PO TABS: 0.5000 mg | ORAL_TABLET | Freq: Two times a day (BID) | ORAL | Status: DC | PRN

## 2014-07-05 MED ORDER — TRAZODONE HCL 50 MG PO TABS
50.0000 mg | ORAL_TABLET | Freq: Every evening | ORAL | Status: DC | PRN
Start: 2014-07-05 — End: 2015-09-10

## 2014-07-05 MED ORDER — ESCITALOPRAM OXALATE 10 MG PO TABS: 10.0000 mg | ORAL_TABLET | Freq: Every day | ORAL | Status: DC

## 2014-07-05 MED ORDER — CLONAZEPAM 0.5 MG PO TABS
1.0000 mg | ORAL_TABLET | Freq: Two times a day (BID) | ORAL | Status: DC | PRN
Start: 2014-07-05 — End: 2014-07-05

## 2014-07-05 NOTE — Progress Notes (Signed)
Pre-visit discussion using our clinic review tool. No additional management support is needed unless otherwise documented below in the visit note.  

## 2014-07-05 NOTE — Patient Instructions (Addendum)
I recommend adding lexapro, an SSRI,  for your anxiety .   Start with 1/2 tablet daily right after dinner.  Increase to full tablet after one week.  If it interferes with your sleep, you can take it in the morning  I also recommend changing the alprazolam medication  to clonazepam  because it lasts longer    Here are the names of several well respected female therapists  In this area that have helped many of our patients  Montel ClockKatherine Wagner    [phone]  [place] Karen [place]*   [phone]  [place] Valerie DillardPadgett [phone]  [place] Jane Perrin  [phone]  [place]rene [place]  * Christian based counselling (we're pretty sure)   Based   The following are definitely Christian :  [facility]  [phone] Harle BattiestJulia Tabor  [phone]

## 2014-07-05 NOTE — Progress Notes (Signed)
Patient ID: Lisa Lamb, female   DOB: 02/09/1956, 59 y.o.   MRN: [id number]    Patient Active Problem List   Diagnosis Date Noted  . Insomnia due to psychological stress 09/25/2013  . Underweight 09/25/2013  . Generalized anxiety disorder 09/24/2013  . Visual changes 06/01/2013  . Epistaxis 06/01/2013  . OA (osteoarthritis) of finger 05/31/2013  . Panic attack as reaction to stress 04/07/2013  . Spider veins 12/13/2012  . Varicose veins of lower extremities with other complications 08/16/2012  . Cervicalgia 12/20/2011  . Migraine headache with aura 11/29/2011  . Cognitive complaints 11/29/2011  . Migraine headache     Subjective:  CC:   Chief Complaint  Patient presents with  . Follow-up    Medication adjustment    HPI:   Lisa MuslimSusan C Springsteen is a 59 y.o. female who presents for  Follow up on medication management of depression/anxiety with weight loss.  She did not tolerate the remeron due to bad dreams, and has continued to lose weight .  Sh eis eating several times per day and increasing her protein intake a but her appetite s very small.  She is worried about the prognosis of her farm animals.  Her husband is divorcing her    Past Medical History  Diagnosis Date  . Migraine headache   . DVT (deep venous thrombosis)   . Anemia     Past Surgical History  Procedure Laterality Date  . Cesarean section  1988       The following portions of the patient's history were reviewed and updated as appropriate: Allergies, current medications, and problem list.    Review of Systems:   Patient denies headache, fevers, malaise, unintentional weight loss, skin rash, eye pain, sinus congestion and sinus pain, sore throat, dysphagia,  hemoptysis , cough, dyspnea, wheezing, chest pain, palpitations, orthopnea, edema, abdominal pain, nausea, melena, diarrhea, constipation, flank pain, dysuria, hematuria, urinary  Frequency, nocturia, numbness, tingling, seizures,  Focal weakness,  Loss of consciousness,  Tremor, insomnia, depression, anxiety, and suicidal ideation.     History   Social History  . Marital Status: Married    Spouse Name: N/A  . Number of Children: N/A  . Years of Education: N/A   Occupational History  . Not on file.   Social History Main Topics  . Smoking status: Never Smoker   . Smokeless tobacco: Never Used  . Alcohol Use: 1.2 oz/week    2 Glasses of wine per week  . Drug Use: No  . Sexual Activity: Not Currently   Other Topics Concern  . Not on file   Social History Narrative    Objective:  Filed Vitals:   07/05/14 1555  BP: 108/64  Pulse: 73  Temp: 98.9 F (37.2 C)  Resp: 14     General appearance: alert, cooperative and appears stated age Ears: normal TM's and external ear canals both ears Throat: lips, mucosa, and tongue normal; teeth and gums normal Neck: no adenopathy, no carotid bruit, supple, symmetrical, trachea midline and thyroid not enlarged, symmetric, no tenderness/mass/nodules Back: symmetric, no curvature. ROM normal. No CVA tenderness. Lungs: clear to auscultation bilaterally Heart: regular rate and rhythm, S1, S2 normal, no murmur, click, rub or gallop Abdomen: soft, non-tender; bowel sounds normal; no masses,  no organomegaly Pulses: 2+ and symmetric Skin: Skin color, texture, turgor normal. No rashes or lesions Lymph nodes: Cervical, supraclavicular, and axillary nodes normal. Psych: affect normal, makes good eye contact. No fidgeting,  Smiles easily.  Denies  suicidal thoughts   Assessment and Plan:  Generalized anxiety disorder Adding lexapro today for persistent anxiety and insomnia.  Changing alprazolam to clonazepam . Referral to Dr Maryruth BunKapur,  And the names of several counselors given to he.r    A total of 40 minutes was spent with patient more than half of which was spent in counseling patient on the above mentioned issues , reviewing and explaining recent labs and imaging studies done, and  coordination of care.  Updated Medication List Outpatient Encounter Prescriptions as of 07/05/2014  Medication Sig  . ondansetron (ZOFRAN-ODT) 4 MG disintegrating tablet dissolve 1 or 2 tablets ON TONGUE every 8 hours if needed for nausea  . SUMAtriptan (IMITREX) 100 MG tablet Take 1 tablet (100 mg total) by mouth once. May repeat in 2 hours if headache persists or recurs.  . traZODone (DESYREL) 50 MG tablet Take 1 tablet (50 mg total) by mouth at bedtime as needed for sleep.  . [DISCONTINUED] ALPRAZolam (XANAX) 0.5 MG tablet take 1 tablet by mouth once daily if needed for anxiety  . [DISCONTINUED] traZODone (DESYREL) 50 MG tablet Take 0.5-1 tablets (25-50 mg total) by mouth at bedtime as needed for sleep.  . clonazePAM (KLONOPIN) 0.5 MG tablet Take 1 tablet (0.5 mg total) by mouth 2 (two) times daily as needed for anxiety. Or insomnia  . escitalopram (LEXAPRO) 10 MG tablet Take 1 tablet (10 mg total) by mouth daily. With dinner  . [DISCONTINUED] clonazePAM (KLONOPIN) 0.5 MG tablet Take 2 tablets (1 mg total) by mouth 2 (two) times daily as needed for anxiety.   No facility-administered encounter medications on file as of 07/05/2014.     Orders Placed This Encounter  Procedures  . Ambulatory referral to Psychiatry    Return in about 1 month (around 08/05/2014).

## 2014-07-07 ENCOUNTER — Encounter: Payer: Self-pay | Admitting: Internal Medicine

## 2014-07-07 NOTE — Assessment & Plan Note (Addendum)
Adding lexapro today for persistent anxiety and insomnia.  Changing alprazolam to clonazepam . Referral to Dr Maryruth BunKapur,  And the names of several counselors given to he.r

## 2014-07-09 ENCOUNTER — Telehealth: Payer: Self-pay | Admitting: Internal Medicine

## 2014-07-09 MED ORDER — ESCITALOPRAM OXALATE 5 MG/5ML PO SOLN: 2.5000 mg | Freq: Every day | ORAL | Status: DC

## 2014-07-09 NOTE — Telephone Encounter (Signed)
Did she start with 1/2 tablet as instructed?

## 2014-07-09 NOTE — Telephone Encounter (Signed)
Patient stated took the Lexapro and made her nauseated and made her sleepy and groggy through out the day patient stated made her dizzy. Patient has not taken medication since. Patient stated she thinks it would help and wondered if there is  A smaller dose and she can titrate up gradually.

## 2014-07-09 NOTE — Telephone Encounter (Signed)
Tel her to stop the medication for 4 or 5 days then resume at 1/2 tablet with her biggest meal. The 10 mg dose did the damage, so we need to withdraw medications for a few days before trying again

## 2014-07-09 NOTE — Telephone Encounter (Signed)
Patient would like to start with 2.5 mg or the bare minimum by using liquid form, patient states she knows her body and that her body will not tolerate the 5 mg dose to start and patient also says that when she tries to cut pill in half pill disintegrates. Please advise.

## 2014-07-09 NOTE — Telephone Encounter (Signed)
Started with whole took half second day

## 2014-07-09 NOTE — Telephone Encounter (Signed)
Patient notified and voiced understanding.

## 2014-07-09 NOTE — Telephone Encounter (Signed)
Ok, liquid lexapr sent to [facility].  Start with 2.5 ml daily , increase by 2.5 mg each  week if tolerated, maxiumum dose 10 mg

## 2014-07-24 IMAGING — CT CT CHEST W/O CM
1 of 2 series · 14 of 32 positions shown, 18 images · non-contrast
Comparison: none

REASON FOR EXAM: Enlarged Lymph Nodes Left Upper Extr
COMMENTS:

PROCEDURE:     KCT - KCT CHEST WITHOUT CONTRAST  - July 18, 2012  [DATE]
RESULT:     Comparison: Left upper extremity Doppler venous ultrasound
07/04/2012
TECHNIQUE: Multiple axial images of the chest were obtained without
intravenous contrast.

[Series 2: chest w/o 3.0 i31f 2 · axial · non-contrast · 0.64mm/px · z∈[-714,-438]mm · 14 of 110 slices shown, 18 images]
[im 9/110  mediastinal]
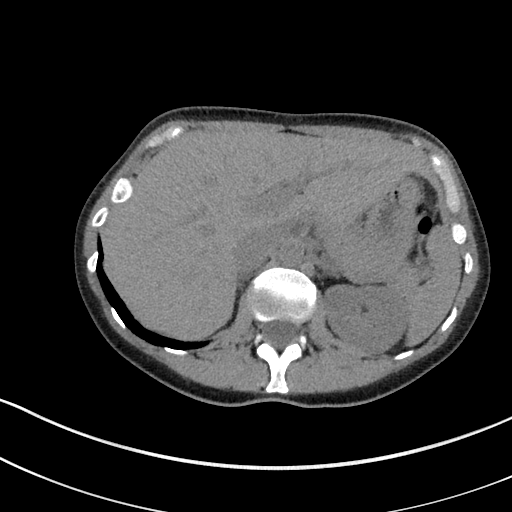
[im 9/110  lung]
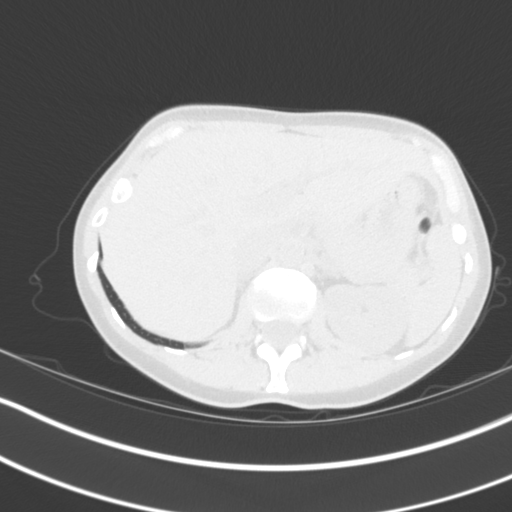
[im 17/110  lung]
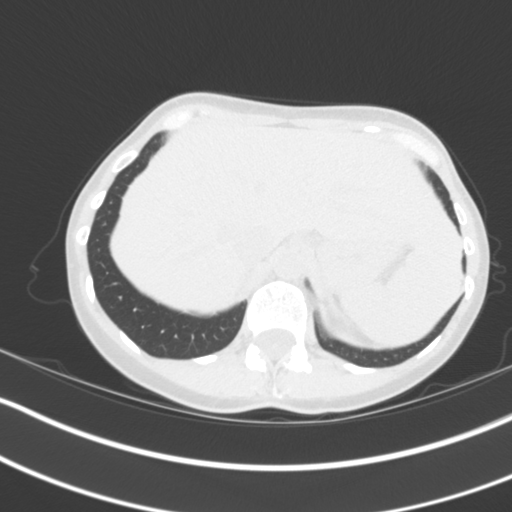
[im 26/110  lung]
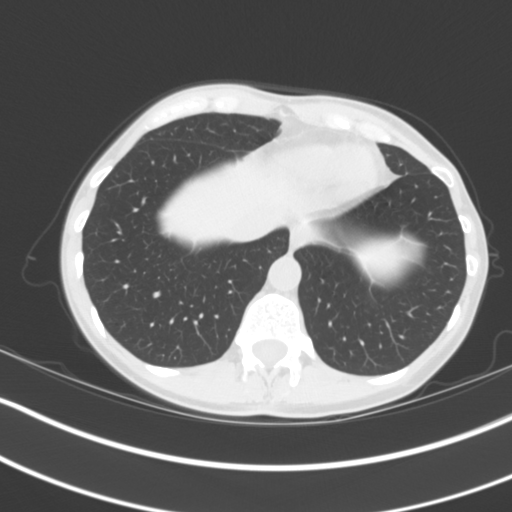
[im 34/110  lung]
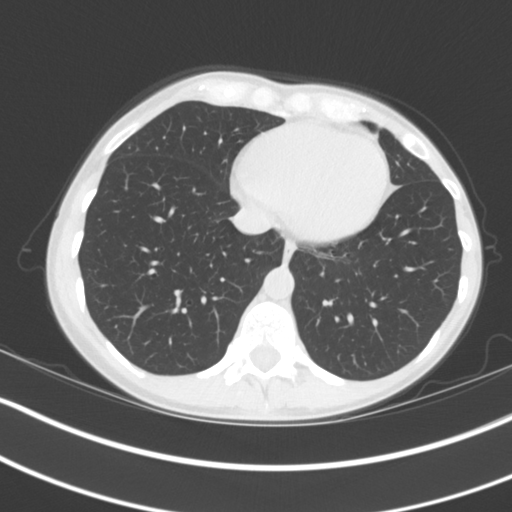
[im 42/110  mediastinal]
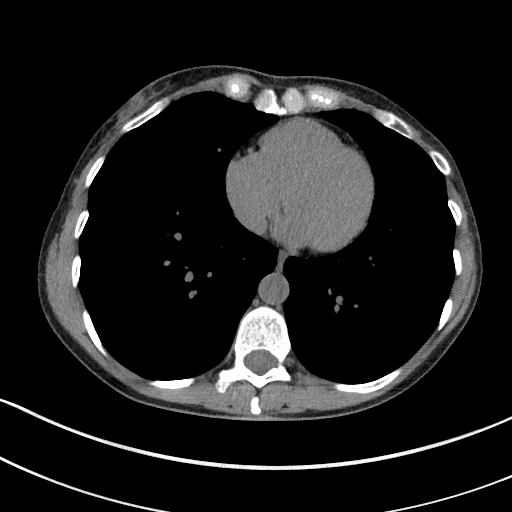
[im 42/110  lung]
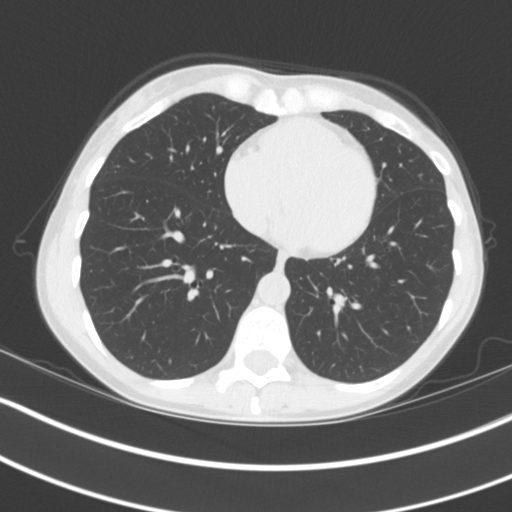
[im 51/110  lung]
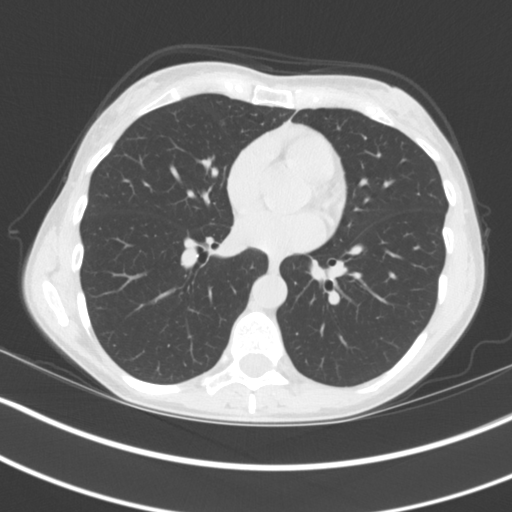
[im 53/110  lung]
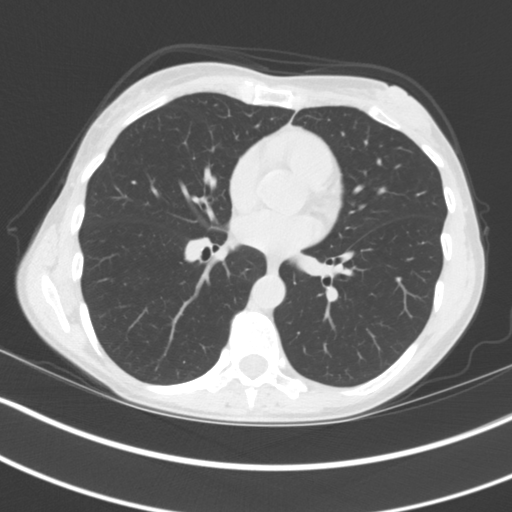
[im 55/110  lung]
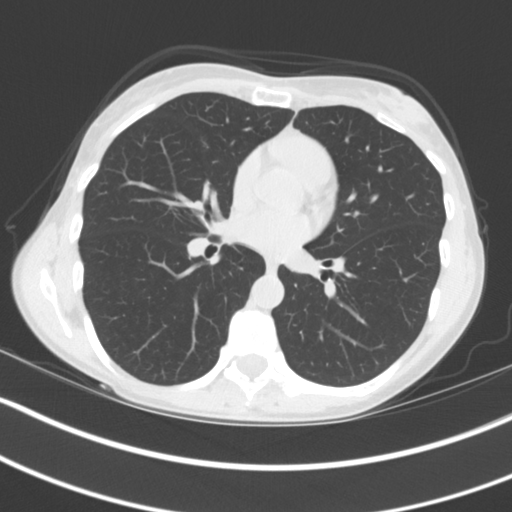
[im 59/110  mediastinal]
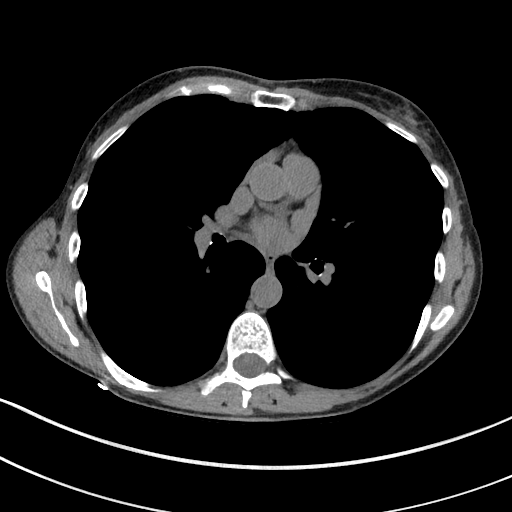
[im 59/110  lung]
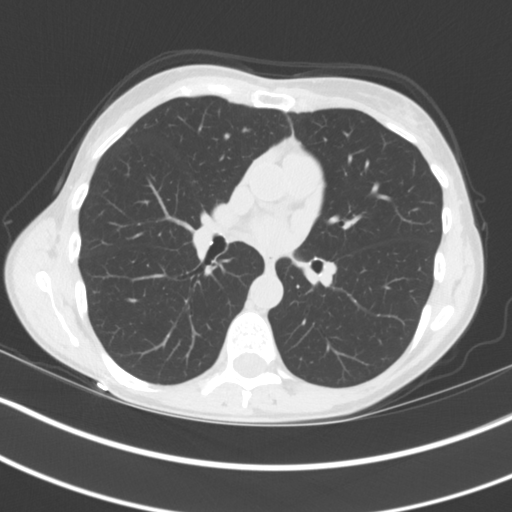
[im 68/110  lung]
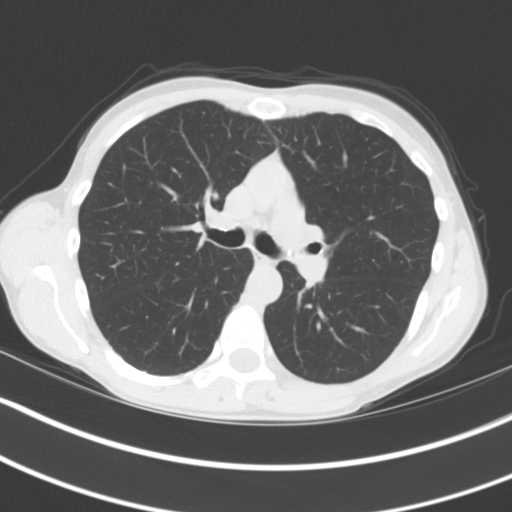
[im 76/110  lung]
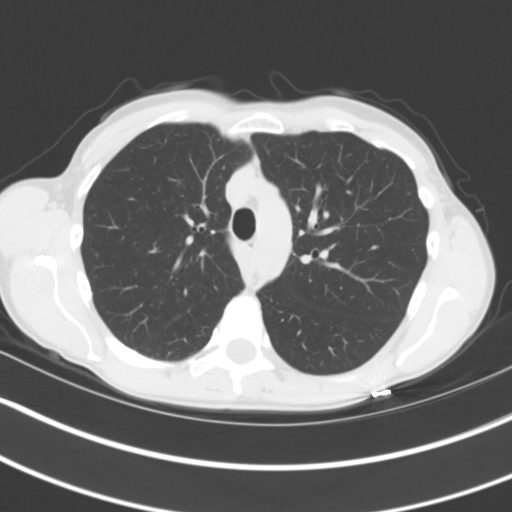
[im 84/110  lung]
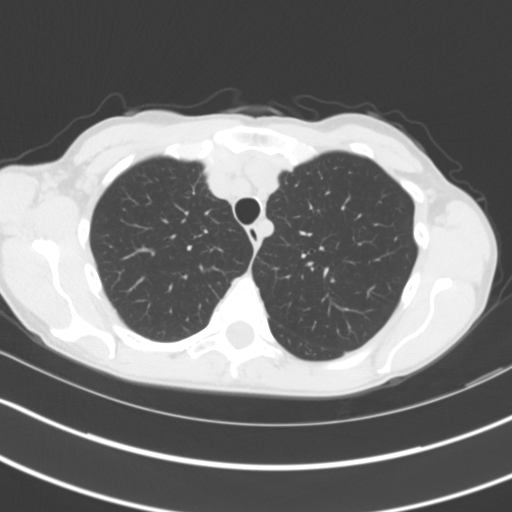
[im 93/110  mediastinal]
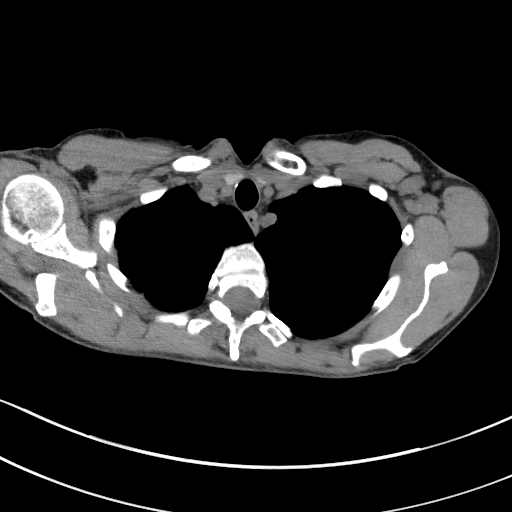
[im 93/110  lung]
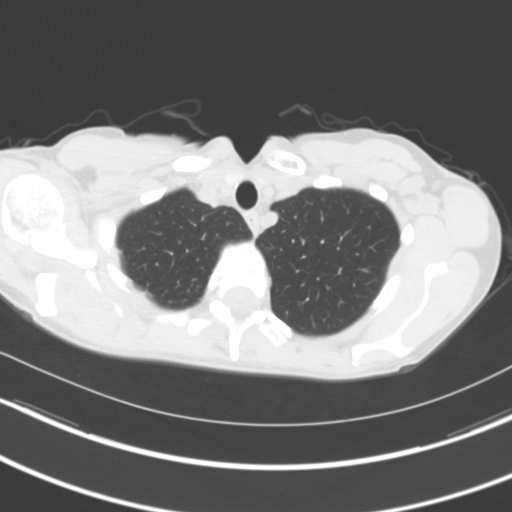
[im 101/110  lung]
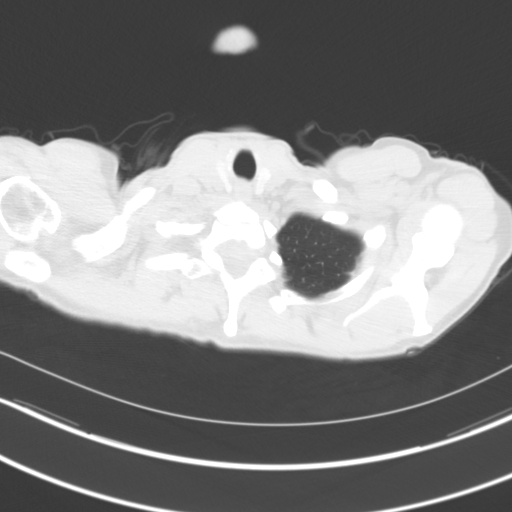

[14 of 32 positions shown; findings below may reference images not displayed]

FINDINGS: No mediastinal or hilar lymphadenopathy. There is a borderline enlarged left
subpectoral lymph node which measures 2.1 x 0.8 cm. There are a few mildly
enlarged left axillary lymph nodes. A representative lymph node measures
x 10 cm. No right axillary lymphadenopathy.

There is a 6 mm subpleural nodule in the right middle lobe, image 67. The
central airways are patent.

No aggressive lytic or sclerotic osseous lesions are identified.
IMPRESSION: 1. Mildly enlarged left axillary lymph nodes and borderline enlarged left
subpectoral lymph node. These are nonspecific. Clinical correlation is
recommended. They do not demonstrate a fatty hilum on the ultrasound images,
and are therefore suspicious.
2. Indeterminate 6 dominant nodule in the right middle lobe. If the patient
is at low risk for lung cancer, recommend 12 month follow-up noncontrast
chest CT.  If the patient is at high risk for lung cancer, recommend 6 to 12
month follow-up noncontrast chest CT.

## 2014-07-25 ENCOUNTER — Telehealth: Payer: Self-pay | Admitting: *Deleted

## 2014-07-25 NOTE — Telephone Encounter (Signed)
Left voicemail asking for a callback. Need to know if pt has had a mammogram done recently or does she have an upcoming appointment. If so need to know when & where. If not, need to know if she is okay for us to schedule.

## 2014-08-05 ENCOUNTER — Ambulatory Visit: Payer: BLUE CROSS/BLUE SHIELD | Admitting: Internal Medicine

## 2014-08-06 ENCOUNTER — Encounter: Payer: Self-pay | Admitting: *Deleted

## 2014-08-06 NOTE — Telephone Encounter (Signed)
Letter mailed to patient.

## 2014-09-12 ENCOUNTER — Telehealth: Payer: Self-pay | Admitting: *Deleted

## 2014-09-12 MED ORDER — ESCITALOPRAM OXALATE 5 MG PO TABS: 2.5000 mg | ORAL_TABLET | Freq: Every day | ORAL | Status: DC

## 2014-09-12 MED ORDER — CLONAZEPAM 0.5 MG PO TABS: 0.5000 mg | ORAL_TABLET | Freq: Two times a day (BID) | ORAL | Status: DC | PRN

## 2014-09-12 NOTE — Telephone Encounter (Signed)
Pt called states she is going out of the country on Oct 02, 2014 for a month.  Pt is requesting Lexapro Rx be changed to tablet form.  Pt is also requesting Klonopin refill.  Please advise

## 2014-09-12 NOTE — Telephone Encounter (Signed)
done

## 2014-09-12 NOTE — Telephone Encounter (Signed)
rx faxed

## 2014-09-19 ENCOUNTER — Telehealth: Payer: Self-pay | Admitting: *Deleted

## 2014-09-19 ENCOUNTER — Telehealth: Payer: Self-pay | Admitting: Internal Medicine

## 2014-09-19 MED ORDER — CLONAZEPAM 0.5 MG PO TABS: 0.5000 mg | ORAL_TABLET | Freq: Two times a day (BID) | ORAL | Status: DC | PRN

## 2014-09-19 NOTE — Telephone Encounter (Signed)
Spoke with April from [facility], states Trazadone 50 mg last filled 7.7.16 #30.  Clonazepam 0.5 mg 7.7.16 #60.

## 2014-09-19 NOTE — Telephone Encounter (Signed)
please find out from pharmacy when they were last filled,  I will not refill without this info. This patient has been very depressed

## 2014-09-19 NOTE — Telephone Encounter (Signed)
No early refills on either.

## 2014-09-19 NOTE — Telephone Encounter (Signed)
Ms Hesser has asked for an early refill on the clonazepam and trazodone , which I have denied.  She has a history of major depressive disorder and is going through a very difficult time due to divorce.  Her reasoning that she is going to be out of the country from august 3rd to august 31st does not make sense since it is only July 21st.    DO NOT REFILL EITHER MEDICATION EARLY

## 2014-09-19 NOTE — Telephone Encounter (Signed)
Pt came into the office requesting early refills on Trazadone 50 mg and Clonazepam 0.5 mg.  Pt states she will be out of the country on the refill dates of medications.  She will be gone from 8.3.16 through 8.30.16.  Please advise refills

## 2014-09-20 NOTE — Telephone Encounter (Signed)
Left VM to return call 

## 2014-09-20 NOTE — Telephone Encounter (Signed)
Spoke with pt, advised of MDs message of no refills.  Pt wanted to know exactly what was relayed to Dr Darrick Huntsman, read message to pt that was sent to Dr Darrick Huntsman.  Pt then states she would call the pharmacy to see what the earliest date they could fill the Rxs.

## 2014-09-24 ENCOUNTER — Telehealth: Payer: Self-pay | Admitting: *Deleted

## 2014-09-24 NOTE — Telephone Encounter (Signed)
Pt called again requesting early refill of Klonopin.  Pt is aware of Dr Ceasar Lund previous decision not to refill medication early however she is calling again.

## 2014-09-24 NOTE — Telephone Encounter (Signed)
i WILL NOT REFILL THE CLONAZEPAM EARLY.

## 2014-09-25 ENCOUNTER — Other Ambulatory Visit: Payer: Self-pay | Admitting: Internal Medicine

## 2014-09-25 MED ORDER — CLONAZEPAM 0.5 MG PO TABS: 0.5000 mg | ORAL_TABLET | Freq: Two times a day (BID) | ORAL | Status: DC | PRN

## 2014-09-25 NOTE — Telephone Encounter (Signed)
MD advised nurse ok to refill on or after 10/01/14 patient aware and will pick up script.

## 2015-01-29 ENCOUNTER — Other Ambulatory Visit: Payer: Self-pay | Admitting: Internal Medicine

## 2015-06-05 IMAGING — CR RIGHT INDEX FINGER 2+V
1 series · 3 of 3 positions shown · non-contrast
Comparison: None.

CLINICAL DATA: Right hand injury. Pain in the MCP joint and PIP
joint of the index finger.

EXAM:
RIGHT INDEX FINGER 2+V

[Series 1: x finger pa right · 0.14mm/px · 3 of 3 slices shown]
[im 1/3]
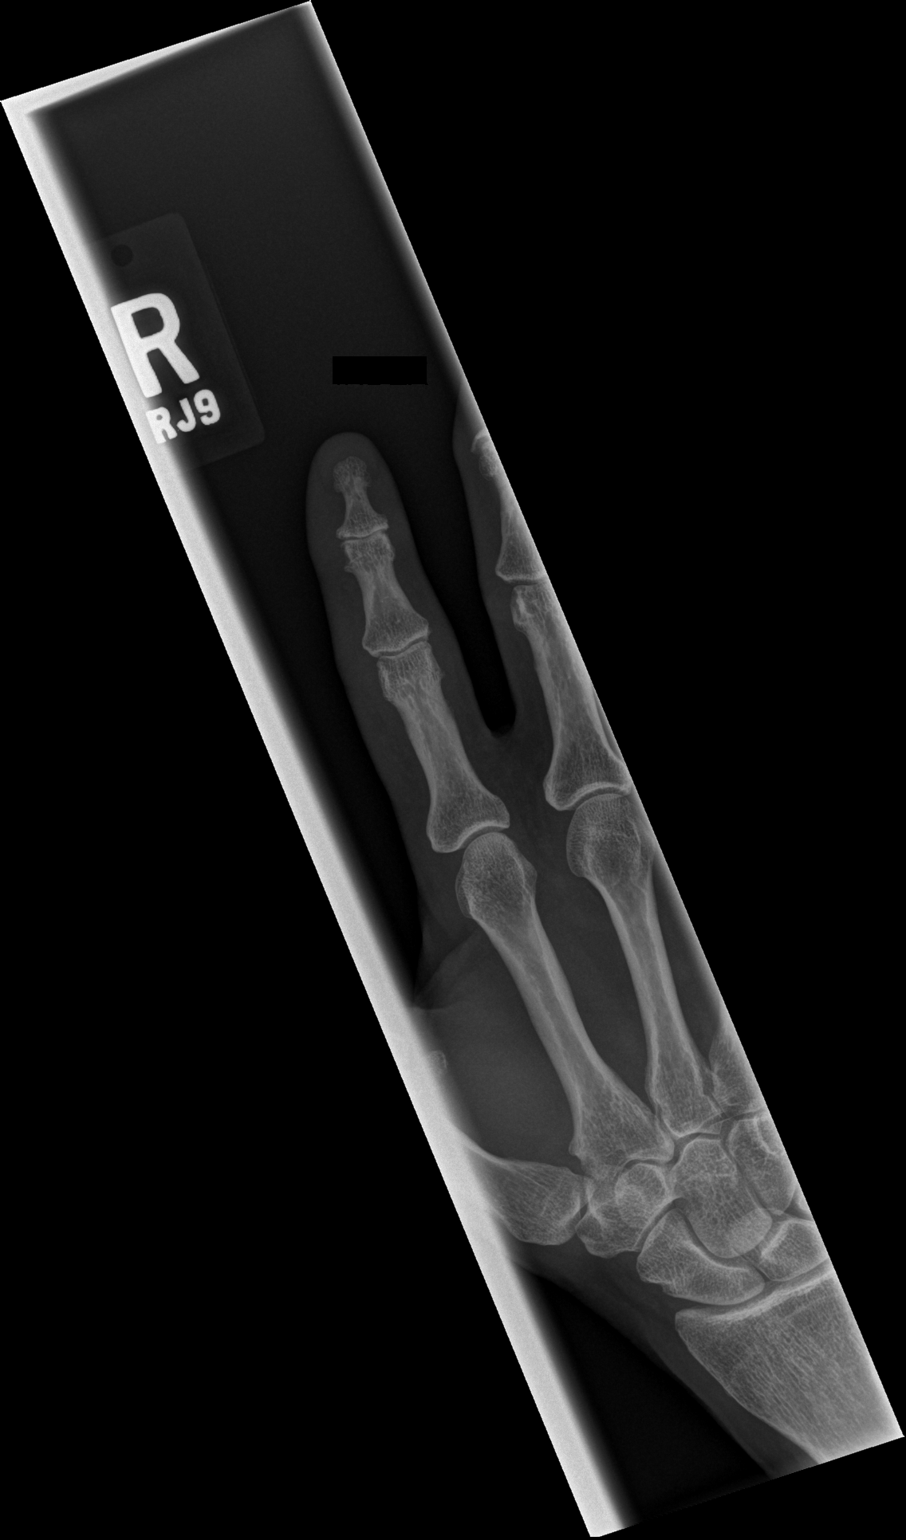
[im 2/3]
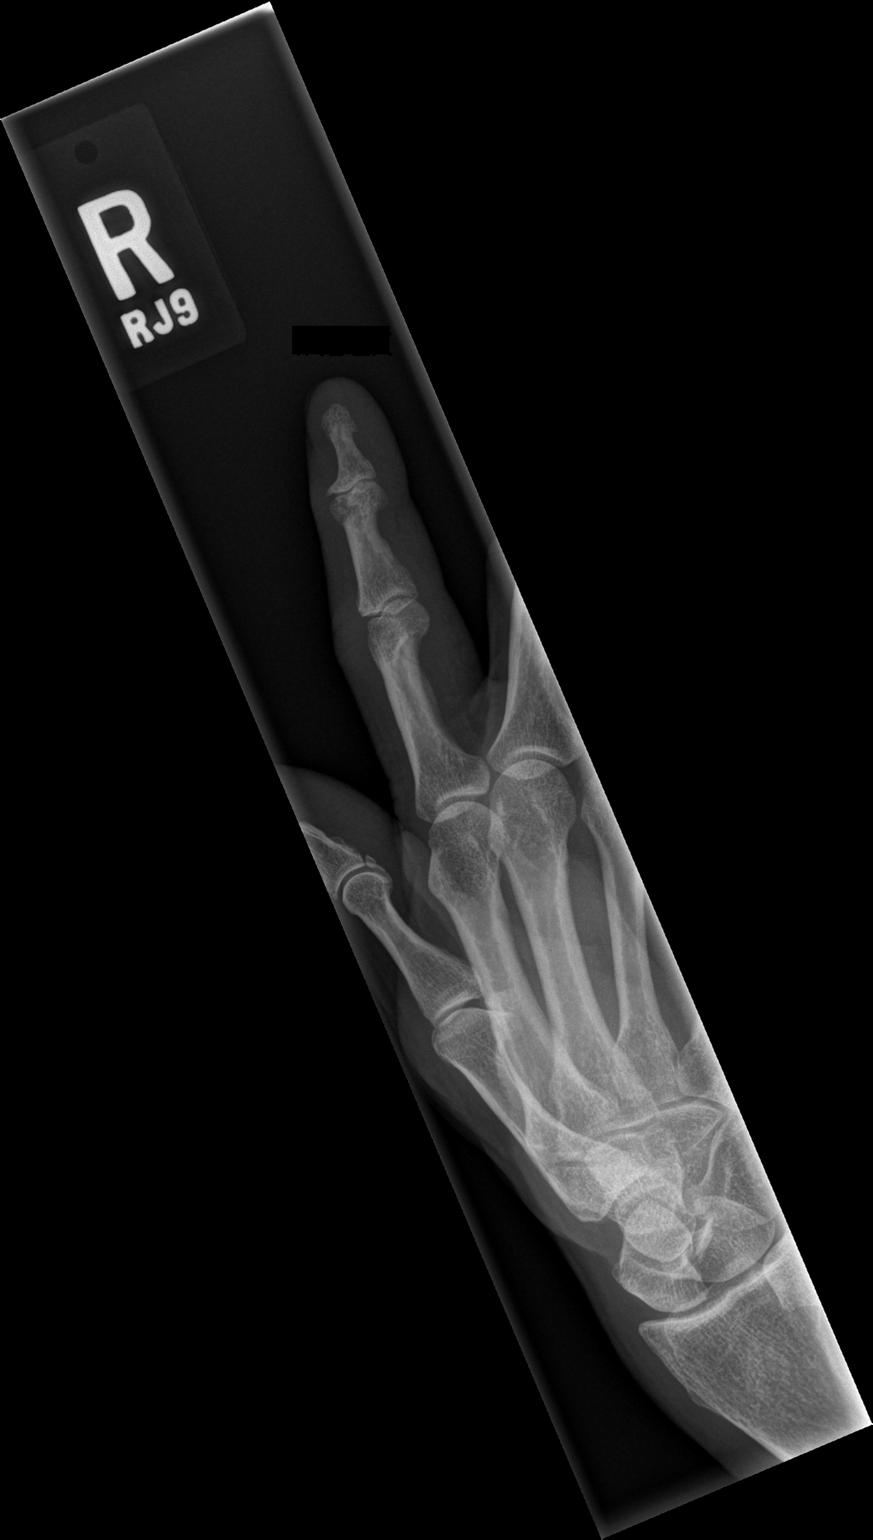
[im 3/3]
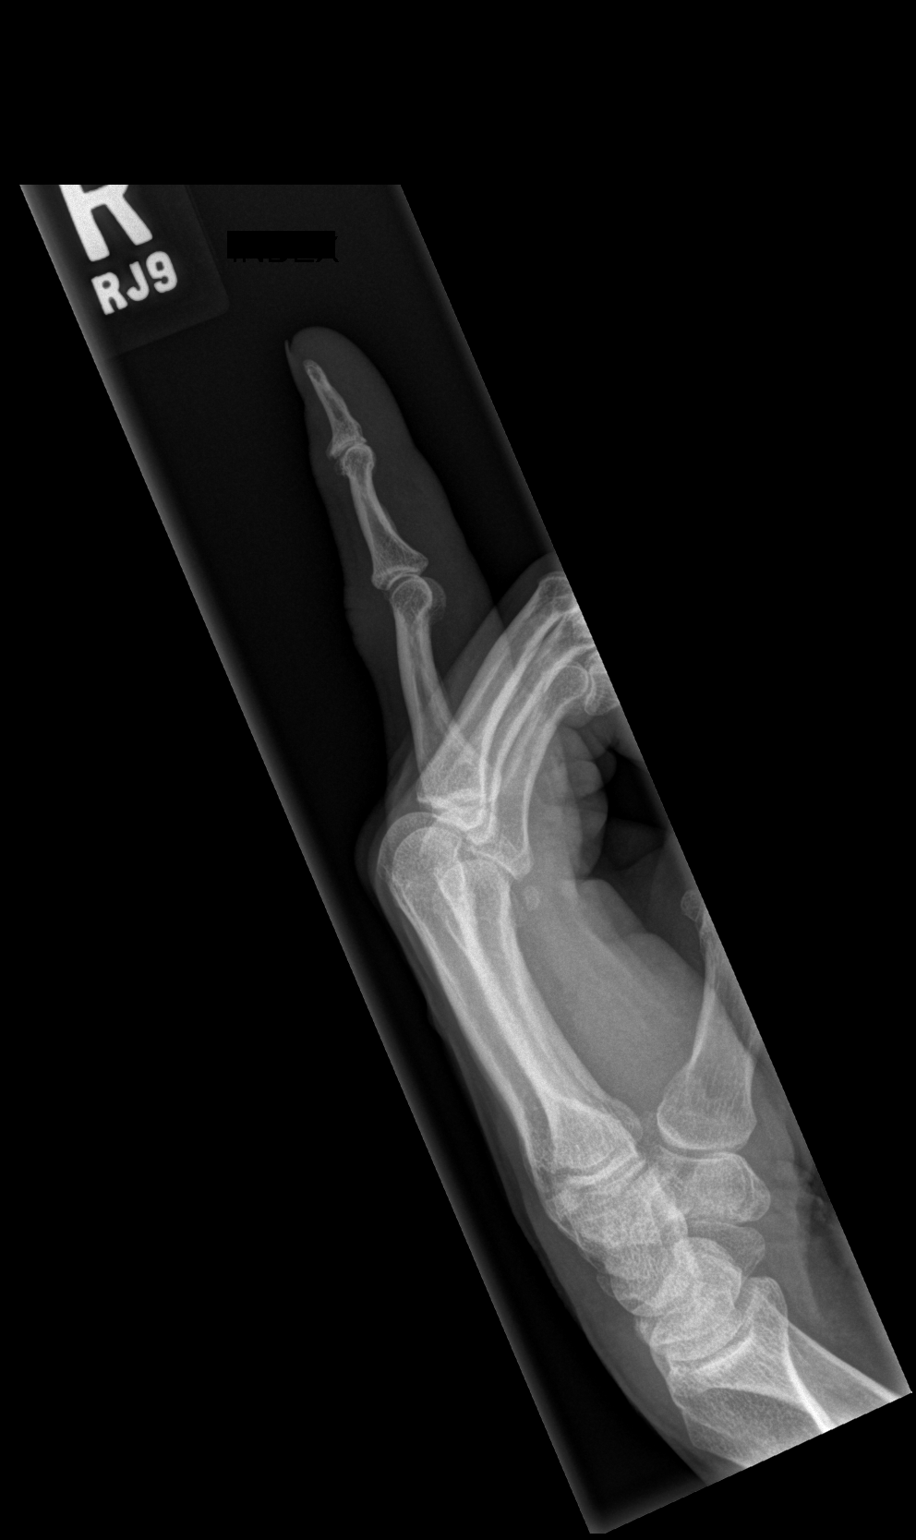

[3 of 3 positions shown; findings below may reference images not displayed]

FINDINGS: Moderate DIP joint osteoarthritis is present. The alignment is
anatomic. No fracture. No erosions. MCP joint appears within normal
limits.
IMPRESSION: DIP joint osteoarthritis.  No acute osseous abnormality.

## 2015-07-16 ENCOUNTER — Other Ambulatory Visit: Payer: Self-pay

## 2015-07-16 NOTE — Telephone Encounter (Signed)
Please advise refill, last OV was in May 2016

## 2015-07-17 MED ORDER — SUMATRIPTAN SUCCINATE 100 MG PO TABS: 100.0000 mg | ORAL_TABLET | Freq: Once | ORAL | Status: DC

## 2015-07-17 NOTE — Telephone Encounter (Signed)
Refilled.  Needs annual exam set up has not had mammogram in over 4 years

## 2015-07-18 ENCOUNTER — Telehealth: Payer: Self-pay

## 2015-07-18 NOTE — Telephone Encounter (Signed)
Can you please set up an annual exam for this patient with Dr. Darrick Huntsmanullo, thanks.

## 2015-07-18 NOTE — Telephone Encounter (Signed)
Pt will call back to schedule, when she has her work schedule

## 2015-09-10 ENCOUNTER — Ambulatory Visit (INDEPENDENT_AMBULATORY_CARE_PROVIDER_SITE_OTHER): Payer: BLUE CROSS/BLUE SHIELD | Admitting: Internal Medicine

## 2015-09-10 ENCOUNTER — Encounter: Payer: Self-pay | Admitting: Internal Medicine

## 2015-09-10 VITALS — BP 102/66 | HR 70 | Temp 98.3°F | Resp 10 | Ht 66.0 in | Wt 105.0 lb

## 2015-09-10 DIAGNOSIS — E559 Vitamin D deficiency, unspecified: Secondary | ICD-10-CM

## 2015-09-10 DIAGNOSIS — R5383 Other fatigue: Secondary | ICD-10-CM

## 2015-09-10 DIAGNOSIS — G43109 Migraine with aura, not intractable, without status migrainosus: Secondary | ICD-10-CM | POA: Diagnosis not present

## 2015-09-10 DIAGNOSIS — F5102 Adjustment insomnia: Secondary | ICD-10-CM

## 2015-09-10 DIAGNOSIS — E785 Hyperlipidemia, unspecified: Secondary | ICD-10-CM | POA: Diagnosis not present

## 2015-09-10 MED ORDER — AMITRIPTYLINE HCL 25 MG PO TABS: 25.0000 mg | ORAL_TABLET | Freq: Every day | ORAL | Status: DC

## 2015-09-10 MED ORDER — ONDANSETRON 4 MG PO TBDP: ORAL_TABLET | ORAL | Status: DC

## 2015-09-10 NOTE — Progress Notes (Signed)
Pre-visit discussion using our clinic review tool. No additional management support is needed unless otherwise documented below in the visit note.  

## 2015-09-10 NOTE — Progress Notes (Signed)
Subjective:  Patient ID: Lisa Lamb, female    DOB: November 20, 1955  Age: 60 y.o. MRN: [id number]  CC: The primary encounter diagnosis was Vitamin D deficiency. Diagnoses of Other fatigue, Hyperlipidemia, Migraine with aura and without status migrainosus, not intractable, and Insomnia due to psychological stress were also pertinent to this visit.  HPI Lisa Lamb presents for annual exam and follow up on GAD.  Last seen May 2016 .  Last interaction was denial of early refills on clonazepam in August of last year.  No refills since,  Was referred to Dr Maryruth Bun in July but cancelled the initial appointment and did not reschedule   Having headaches treating as migraines once a week.  Gets an aura and takes the medications to prevent it. Has not missed work but feels very lethargic,  Using only  50 mg usually.  Major financial stressors..   Worried about finding homes for her 6 [place] ( ponies)  Needs to sell them.  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  In  bed by 9 awake by 3 lies there, and lies awake until 6 am   Does not want mammogram   Outpatient Prescriptions Prior to Visit  Medication Sig Dispense Refill  . clonazePAM (KLONOPIN) 0.5 MG tablet Take 1 tablet (0.5 mg total) by mouth 2 (two) times daily as needed for anxiety. Or insomnia 60 tablet 0  . SUMAtriptan (IMITREX) 100 MG tablet Take 1 tablet (100 mg total) by mouth once. May repeat in 2 hours if headache persists or recurs. 10 tablet 5  . ondansetron (ZOFRAN-ODT) 4 MG disintegrating tablet dissolve 1 or 2 tablets ON TONGUE every 8 hours if needed for nausea 30 tablet 3  . escitalopram (LEXAPRO) 5 MG tablet Take 0.5 tablets (2.5 mg total) by mouth daily. (Patient not taking: Reported on 09/10/2015) 30 tablet 1  . escitalopram (LEXAPRO) 5 MG/5ML solution Take 2.5 mLs (2.5 mg total) by mouth daily. Increase weekly by 2.5 mg as tolerated , up to to 10 mg daily (Patient not taking: Reported on  09/10/2015) 240 mL 12  . traZODone (DESYREL) 50 MG tablet Take 1 tablet (50 mg total) by mouth at bedtime as needed for sleep. (Patient not taking: Reported on 09/10/2015) 30 tablet 5   No facility-administered medications prior to visit.    Review of Systems;  Patient denies headache, fevers, malaise, unintentional weight loss, skin rash, eye pain, sinus congestion and sinus pain, sore throat, dysphagia,  hemoptysis , cough, dyspnea, wheezing, chest pain, palpitations, orthopnea, edema, abdominal pain, nausea, melena, diarrhea, constipation, flank pain, dysuria, hematuria, urinary  Frequency, nocturia, numbness, tingling, seizures,  Focal weakness, Loss of consciousness,  Tremor, insomnia, depression, anxiety, and suicidal ideation.      Objective:  BP 102/66 mmHg  Pulse 70  Temp(Src) 98.3 F (36.8 C) (Oral)  Resp 10  Ht  (1.676 m)  Wt 105 lb (47.628 kg)  BMI 16.96 kg/m2  SpO2 97%  BP Readings from Last 3 Encounters:  09/10/15 102/66  07/05/14 108/64  03/20/14 122/64    Wt Readings from Last 3 Encounters:  09/10/15 105 lb (47.628 kg)  07/05/14 100 lb (45.36 kg)  03/20/14 107 lb 4 oz (48.648 kg)    General appearance: alert, cooperative and appears stated age Ears: normal TM's and external ear canals both ears Throat: lips, mucosa, and tongue normal; teeth and gums normal Neck: no adenopathy, no carotid bruit, supple,  symmetrical, trachea midline and thyroid not enlarged, symmetric, no tenderness/mass/nodules Back: symmetric, no curvature. ROM normal. No CVA tenderness. Lungs: clear to auscultation bilaterally Heart: regular rate and rhythm, S1, S2 normal, no murmur, click, rub or gallop Abdomen: soft, non-tender; bowel sounds normal; no masses,  no organomegaly Pulses: 2+ and symmetric Skin: Skin color, texture, turgor normal. No rashes or lesions Lymph nodes: Cervical, supraclavicular, and axillary nodes normal.  No results found for: HGBA1C  Lab Results    Component Value Date   CREATININE 0.90 09/10/2015   CREATININE 1.0 04/06/2013    Lab Results  Component Value Date   WBC 6.8 09/10/2015   HGB 12.9 09/10/2015   HCT 38.3 09/10/2015   PLT 251.0 09/10/2015   GLUCOSE 93 09/10/2015   CHOL 233* 09/10/2015   TRIG 58.0 09/10/2015   HDL 88.10 09/10/2015   LDLDIRECT 88.6 04/06/2013   LDLCALC 133* 09/10/2015   ALT 13 09/10/2015   AST 16 09/10/2015   NA 149* 09/10/2015   K 4.4 09/10/2015   CL 97 09/10/2015   CREATININE 0.90 09/10/2015   BUN 23 09/10/2015   CO2 32 09/10/2015   TSH 0.79 09/10/2015     Assessment & Plan:   Problem List Items Addressed This Visit    Migraine headache with aura (Chronic)    Discussed starting a preventive medication given her weekly occurrence      Relevant Medications   amitriptyline (ELAVIL) 25 MG tablet   Insomnia due to psychological stress    Discussed need to avoid benzodiazepines. Trial of elavil, both for insomnia, and prevention of migraines.       Other Visit Diagnoses    Vitamin D deficiency    -  Primary    Relevant Orders    VITAMIN D 25 Hydroxy (Vit-D Deficiency, Fractures) (Completed)    Other fatigue        Relevant Orders    Comprehensive metabolic panel (Completed)    TSH (Completed)    CBC with Differential/Platelet (Completed)    Hyperlipidemia        Relevant Orders    Lipid panel (Completed)      A total of 25 minutes of face to face time was spent with patient more than half of which was spent in counselling about the above mentioned conditions  and coordination of care   I have discontinued Ms. Bower's traZODone, escitalopram, and escitalopram. I am also having her start on amitriptyline. Additionally, I am having her maintain her clonazePAM, SUMAtriptan, and ondansetron.  Meds ordered this encounter  Medications  . amitriptyline (ELAVIL) 25 MG tablet    Sig: Take 1 tablet (25 mg total) by mouth at bedtime.    Dispense:  90 tablet    Refill:  1  .  ondansetron (ZOFRAN-ODT) 4 MG disintegrating tablet    Sig: dissolve 1 or 2 tablets ON TONGUE every 8 hours if needed for nausea    Dispense:  30 tablet    Refill:  5    Medications Discontinued During This Encounter  Medication Reason  . traZODone (DESYREL) 50 MG tablet   . escitalopram (LEXAPRO) 5 MG tablet   . escitalopram (LEXAPRO) 5 MG/5ML solution   . ondansetron (ZOFRAN-ODT) 4 MG disintegrating tablet Reorder    Follow-up: No Follow-up on file.   Sherlene ShamsULLO, Sameka Bagent L, MD

## 2015-09-10 NOTE — Patient Instructions (Signed)
I am recommending a trial of Elavil to take before bedtime to help with your insomnia and your headaches  Start with 25 mg ,  1/2 tablet,  You can increase every week by 25 mg if needed,  Up to 100  Mg    I have refilled the imitrex and Zofran

## 2015-09-11 LAB — COMPREHENSIVE METABOLIC PANEL
ALBUMIN: 4.6 g/dL (ref 3.5–5.2)
ALT: 13 U/L (ref 0–35)
AST: 16 U/L (ref 0–37)
Alkaline Phosphatase: 36 U/L — ABNORMAL LOW (ref 39–117)
BUN: 23 mg/dL (ref 6–23)
CHLORIDE: 97 meq/L (ref 96–112)
CO2: 32 meq/L (ref 19–32)
Calcium: 10 mg/dL (ref 8.4–10.5)
Creatinine, Ser: 0.9 mg/dL (ref 0.40–1.20)
GFR: 68.03 mL/min (ref >60.00)
Glucose, Bld: 93 mg/dL (ref 70–99)
POTASSIUM: 4.4 meq/L (ref 3.5–5.1)
SODIUM: 149 meq/L — AB (ref 135–145)
Total Bilirubin: 0.6 mg/dL (ref 0.2–1.2)
Total Protein: 7.4 g/dL (ref 6.0–8.3)

## 2015-09-11 LAB — CBC WITH DIFFERENTIAL/PLATELET
BASOS ABS: 0 10*3/uL (ref 0.0–0.1)
Basophils Relative: 0.3 % (ref 0.0–3.0)
EOS PCT: 2.5 % (ref 0.0–5.0)
Eosinophils Absolute: 0.2 10*3/uL (ref 0.0–0.7)
HEMATOCRIT: 38.3 % (ref 36.0–46.0)
Hemoglobin: 12.9 g/dL (ref 12.0–15.0)
Lymphocytes Relative: 27.9 % (ref 12.0–46.0)
Lymphs Abs: 1.9 10*3/uL (ref 0.7–4.0)
MCHC: 33.5 g/dL (ref 30.0–36.0)
MCV: 91 fl (ref 78.0–100.0)
MONOS PCT: 6.4 % (ref 3.0–12.0)
Monocytes Absolute: 0.4 10*3/uL (ref 0.1–1.0)
NEUTROS ABS: 4.2 10*3/uL (ref 1.4–7.7)
Neutrophils Relative %: 62.9 % (ref 43.0–77.0)
Platelets: 251 10*3/uL (ref 150.0–400.0)
RBC: 4.21 Mil/uL (ref 3.87–5.11)
RDW: 13.3 % (ref 11.5–15.5)
WBC: 6.8 10*3/uL (ref 4.0–10.5)

## 2015-09-11 LAB — LIPID PANEL
CHOL/HDL RATIO: 3
Cholesterol: 233 mg/dL — ABNORMAL HIGH (ref 0–200)
HDL: 88.1 mg/dL (ref >39.00)
LDL Cholesterol: 133 mg/dL — ABNORMAL HIGH (ref 0–99)
NONHDL: 145.08
Triglycerides: 58 mg/dL (ref 0.0–149.0)
VLDL: 11.6 mg/dL (ref 0.0–40.0)

## 2015-09-11 LAB — VITAMIN D 25 HYDROXY (VIT D DEFICIENCY, FRACTURES): VITD: 28.49 ng/mL — AB (ref 30.00–100.00)

## 2015-09-11 LAB — TSH: TSH: 0.79 u[IU]/mL (ref 0.35–4.50)

## 2015-09-11 NOTE — Assessment & Plan Note (Signed)
Discussed starting a preventive medication given her weekly occurrence

## 2015-09-11 NOTE — Assessment & Plan Note (Signed)
Discussed need to avoid benzodiazepines. Trial of elavil, both for insomnia, and prevention of migraines.

## 2015-09-12 ENCOUNTER — Encounter: Payer: Self-pay | Admitting: *Deleted

## 2015-09-30 ENCOUNTER — Other Ambulatory Visit: Payer: Self-pay | Admitting: Internal Medicine

## 2015-09-30 MED ORDER — CLONAZEPAM 0.5 MG PO TABS: 0.5000 mg | ORAL_TABLET | Freq: Two times a day (BID) | ORAL | 2 refills | Status: DC | PRN

## 2015-09-30 NOTE — Telephone Encounter (Signed)
I see where it says 09/25/2014, but I'm not seeing a fill date for this year. Maybe I'm not seeing something that you are.

## 2015-09-30 NOTE — Telephone Encounter (Signed)
It appears that this was refilled on July 27 for refill date of August 2 . Can you check with pharmacy?

## 2015-09-30 NOTE — Telephone Encounter (Signed)
Last seen 09/10/15. You discussed with patient about trying to get off benzodiazepines.

## 2015-09-30 NOTE — Telephone Encounter (Signed)
You are correct!  I will refill

## 2016-04-13 ENCOUNTER — Telehealth: Payer: Self-pay | Admitting: Internal Medicine

## 2016-04-13 NOTE — Telephone Encounter (Signed)
Pt called and is requesting a refill on her clonazePAM (KLONOPIN) 0.5 MG tablet. Please advise, thank you!  Ph[facility] - [facility] [address] [place] - [id number] [facility] [address]  Call pt @ [phone]

## 2016-04-14 MED ORDER — CLONAZEPAM 0.5 MG PO TABS: 0.5000 mg | ORAL_TABLET | Freq: Two times a day (BID) | ORAL | 0 refills | Status: DC | PRN

## 2016-04-14 NOTE — Telephone Encounter (Signed)
Refill for 30 days only.  OFFICE VISIT NEEDED prior to any more refills 

## 2016-04-14 NOTE — Telephone Encounter (Signed)
Clonazepam LF 09/30/15 # 60 w/ @ [facility].  LOV: 09/10/15 No existing Please advise

## 2016-04-15 NOTE — Telephone Encounter (Signed)
Rx faxed to pharmacy. I called pt to schedule OV with PCP. No answer. Left mess for patient to call back.

## 2016-08-20 ENCOUNTER — Other Ambulatory Visit: Payer: Self-pay | Admitting: Internal Medicine

## 2016-08-23 ENCOUNTER — Telehealth: Payer: Self-pay

## 2016-08-23 NOTE — Telephone Encounter (Signed)
Refill denied not until she is actually seen in ov

## 2016-08-23 NOTE — Telephone Encounter (Signed)
Rx request for Clonopin 0.5 mg Last OV 09/10/2015 Last Refilled 04/14/2016 Was to schedule OV last refilled  but never did  No new appt.

## 2016-08-24 ENCOUNTER — Encounter: Payer: Self-pay | Admitting: Internal Medicine

## 2016-08-24 ENCOUNTER — Other Ambulatory Visit: Payer: Self-pay | Admitting: Internal Medicine

## 2016-08-24 MED ORDER — SUMATRIPTAN SUCCINATE 100 MG PO TABS: ORAL_TABLET | ORAL | 5 refills | Status: AC

## 2016-08-25 ENCOUNTER — Encounter: Payer: Self-pay | Admitting: Internal Medicine

## 2016-08-25 ENCOUNTER — Ambulatory Visit (INDEPENDENT_AMBULATORY_CARE_PROVIDER_SITE_OTHER): Payer: BLUE CROSS/BLUE SHIELD | Admitting: Internal Medicine

## 2016-08-25 DIAGNOSIS — F411 Generalized anxiety disorder: Secondary | ICD-10-CM

## 2016-08-25 DIAGNOSIS — F5102 Adjustment insomnia: Secondary | ICD-10-CM

## 2016-08-25 MED ORDER — CLONAZEPAM 0.5 MG PO TABS: 0.5000 mg | ORAL_TABLET | Freq: Every day | ORAL | 5 refills | Status: DC

## 2016-08-25 NOTE — Telephone Encounter (Signed)
Patient has requested to have this refilled today  Pt contact [phone]

## 2016-08-25 NOTE — Patient Instructions (Signed)
  I have refilled your clonazepam for nighttime use for 6 months  Please try to establish care as soon as possible once you move so you don't have to drive back her for a 6 month follow up   You are VERY BEHIND in screening for breast, colon and cervical cancer  . Please consider resuming these soon

## 2016-08-25 NOTE — Progress Notes (Signed)
Subjective:  Patient ID: Lisa Lamb, female    DOB: 06/24/1955  Age: 61 y.o. MRN: [id number]  CC: Diagnoses of Insomnia due to psychological stress and Generalized anxiety disorder were pertinent to this visit.  HPI Lisa Lamb presents for medication refill.  Patient requesting refill on clonazepam for managment of insomnia due to GAD.  Marland Kitchen. Last seen July 2017.  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  Using clonazepam 1 tablet daily at bedtiem to help sleep,  Denies using alcohol .  Last mammogram 2010 Last PAP smear 2010 Last colonoscopy 2010   Underweight,  Weight stable   [place]  She has been having Increased anxiety due to the responsibilities of finding homes for two of her her ponies.   Outpatient Medications Prior to Visit  Medication Sig Dispense Refill  . SUMAtriptan (IMITREX) 100 MG tablet TAKE 1 TABLET BY MOUTH ONCE. MAY REPEAT IN 2 HOURS IF HEADACHE PERSISTS OR RECURS 10 tablet 5  . clonazePAM (KLONOPIN) 0.5 MG tablet Take 1 tablet (0.5 mg total) by mouth 2 (two) times daily as needed for anxiety. Or insomnia 60 tablet 0  . amitriptyline (ELAVIL) 25 MG tablet Take 1 tablet (25 mg total) by mouth at bedtime. (Patient not taking: Reported on 08/25/2016) 90 tablet 1  . ondansetron (ZOFRAN-ODT) 4 MG disintegrating tablet dissolve 1 or 2 tablets ON TONGUE every 8 hours if needed for nausea (Patient not taking: Reported on 08/25/2016) 30 tablet 5   No facility-administered medications prior to visit.     Review of Systems;  Patient denies headache, fevers, malaise, unintentional weight loss, skin rash, eye pain, sinus congestion and sinus pain, sore throat,  dysphagia,  hemoptysis , cough, dyspnea, wheezing, chest pain, palpitations, orthopnea, edema, abdominal pain, nausea, melena, diarrhea, constipation, flank pain, dysuria, hematuria, urinary  Frequency, nocturia, numbness, tingling, seizures,  Focal weakness, Loss of consciousness,  Tremor, insomnia, depression, anxiety, and suicidal ideation.      Objective:  BP 110/64 (BP Location: Left Arm, Patient Position: Sitting, Cuff Size: Normal)   Pulse 76   Temp 98.6 F (37 C) (Oral)   Resp 16   Ht 5\' 6"  (1.676 m)   Wt 106 lb 9.6 oz (48.4 kg)   SpO2 98%   BMI 17.21 kg/m   BP Readings from Last 3 Encounters:  08/25/16 110/64  09/10/15 102/66  07/05/14 108/64    Wt Readings from Last 3 Encounters:  08/25/16 106 lb 9.6 oz (48.4 kg)  09/10/15 105 lb (47.6 kg)  07/05/14 100 lb (45.4 kg)    General appearance: alert, cooperative and appears stated age Ears: normal TM's and external ear canals both ears Throat: lips, mucosa, and tongue normal; teeth and gums normal Neck: no adenopathy, no carotid bruit, supple, symmetrical, trachea midline and thyroid not enlarged, symmetric, no tenderness/mass/nodules Back: symmetric, no curvature. ROM normal. No CVA tenderness. Lungs: clear to auscultation bilaterally Heart: regular rate and rhythm, S1, S2 normal, no murmur, click, rub or gallop Abdomen: soft, non-tender; bowel sounds normal; no masses,  no organomegaly Pulses: 2+ and symmetric Skin: Skin color, texture, turgor normal. No rashes or lesions  Lymph nodes: Cervical, supraclavicular, and axillary nodes normal. Psych: affect normal, makes good eye contact. No fidgeting,  Smiles easily.  Denies suicidal thoughts   No results found for: HGBA1C  Lab Results  Component Value Date   CREATININE 0.90 09/10/2015   CREATININE 1.0 04/06/2013    Lab Results  Component Value Date   WBC 6.8 09/10/2015   HGB 12.9 09/10/2015   HCT 38.3 09/10/2015   PLT 251.0 09/10/2015   GLUCOSE 93 09/10/2015     CHOL 233 (H) 09/10/2015   TRIG 58.0 09/10/2015   HDL 88.10 09/10/2015   LDLDIRECT 88.6 04/06/2013   LDLCALC 133 (H) 09/10/2015   ALT 13 09/10/2015   AST 16 09/10/2015   NA 149 (H) 09/10/2015   K 4.4 09/10/2015   CL 97 09/10/2015   CREATININE 0.90 09/10/2015   BUN 23 09/10/2015   CO2 32 09/10/2015   TSH 0.79 09/10/2015     Assessment & Plan:   Problem List Items Addressed This Visit    Insomnia due to psychological stress    Managed with clonazepam . She failed a trial of elavil  for insomnia.       Generalized anxiety disorder    She did not tolerate lexapro trial for management of persistent anxiety and insomnia.  Continue clonazepam .         A total of 25 minutes of face to face time was spent with patient more than half of which was spent in counselling about the above mentioned conditions  and coordination of care    I have discontinued Ms. Vanorman's amitriptyline and ondansetron. I have also changed her clonazePAM. Additionally, I am having her maintain her SUMAtriptan.  Meds ordered this encounter  Medications  . clonazePAM (KLONOPIN) 0.5 MG tablet    Sig: Take 1 tablet (0.5 mg total) by mouth at bedtime. Or insomnia    Dispense:  30 tablet    Refill:  5    Medications Discontinued During This Encounter  Medication Reason  . amitriptyline (ELAVIL) 25 MG tablet Patient has not taken in last 30 days  . ondansetron (ZOFRAN-ODT) 4 MG disintegrating tablet Patient has not taken in last 30 days  . clonazePAM (KLONOPIN) 0.5 MG tablet Reorder    Follow-up: No Follow-up on file.   Sherlene Shams, MD

## 2016-08-25 NOTE — Telephone Encounter (Signed)
Pt is scheduled for today at 2:00 pm

## 2016-08-27 NOTE — Assessment & Plan Note (Signed)
She did not tolerate lexapro trial for management of persistent anxiety and insomnia.  Continue clonazepam .

## 2016-08-27 NOTE — Assessment & Plan Note (Signed)
Managed with clonazepam . She failed a trial of elavil  for insomnia.

## 2020-03-25 ENCOUNTER — Encounter: Payer: Self-pay | Admitting: *Deleted

## 2021-04-03 ENCOUNTER — Ambulatory Visit (INDEPENDENT_AMBULATORY_CARE_PROVIDER_SITE_OTHER): Payer: 59

## 2021-04-03 ENCOUNTER — Encounter: Payer: Self-pay | Admitting: Internal Medicine

## 2021-04-03 ENCOUNTER — Ambulatory Visit (INDEPENDENT_AMBULATORY_CARE_PROVIDER_SITE_OTHER): Payer: 59 | Admitting: Internal Medicine

## 2021-04-03 ENCOUNTER — Other Ambulatory Visit: Payer: Self-pay

## 2021-04-03 VITALS — BP 130/80 | HR 67 | Temp 98.2°F | Ht 66.0 in | Wt 109.1 lb

## 2021-04-03 DIAGNOSIS — R140 Abdominal distension (gaseous): Secondary | ICD-10-CM

## 2021-04-03 DIAGNOSIS — K5909 Other constipation: Secondary | ICD-10-CM

## 2021-04-03 DIAGNOSIS — Z862 Personal history of diseases of the blood and blood-forming organs and certain disorders involving the immune mechanism: Secondary | ICD-10-CM

## 2021-04-03 DIAGNOSIS — K5904 Chronic idiopathic constipation: Secondary | ICD-10-CM | POA: Diagnosis not present

## 2021-04-03 DIAGNOSIS — E559 Vitamin D deficiency, unspecified: Secondary | ICD-10-CM

## 2021-04-03 DIAGNOSIS — M19041 Primary osteoarthritis, right hand: Secondary | ICD-10-CM

## 2021-04-03 LAB — IBC + FERRITIN
Ferritin: 86 ng/mL (ref 10.0–291.0)
Iron: 76 ug/dL (ref 42–145)
Saturation Ratios: 24.1 % (ref 20.0–50.0)
TIBC: 315 ug/dL (ref 250.0–450.0)
Transferrin: 225 mg/dL (ref 212.0–360.0)

## 2021-04-03 LAB — COMPREHENSIVE METABOLIC PANEL
ALT: 7 U/L (ref 0–35)
AST: 15 U/L (ref 0–37)
Albumin: 4.5 g/dL (ref 3.5–5.2)
Alkaline Phosphatase: 43 U/L (ref 39–117)
BUN: 13 mg/dL (ref 6–23)
CO2: 31 mEq/L (ref 19–32)
Calcium: 9.6 mg/dL (ref 8.4–10.5)
Chloride: 102 mEq/L (ref 96–112)
Creatinine, Ser: 0.83 mg/dL (ref 0.40–1.20)
GFR: 74.24 mL/min (ref >60.00)
Glucose, Bld: 81 mg/dL (ref 70–99)
Potassium: 4.2 mEq/L (ref 3.5–5.1)
Sodium: 139 mEq/L (ref 135–145)
Total Bilirubin: 0.6 mg/dL (ref 0.2–1.2)
Total Protein: 7 g/dL (ref 6.0–8.3)

## 2021-04-03 LAB — CBC WITH DIFFERENTIAL/PLATELET
Basophils Absolute: 0 10*3/uL (ref 0.0–0.1)
Basophils Relative: 0.7 % (ref 0.0–3.0)
Eosinophils Absolute: 0.1 10*3/uL (ref 0.0–0.7)
Eosinophils Relative: 2.3 % (ref 0.0–5.0)
HCT: 38.8 % (ref 36.0–46.0)
Hemoglobin: 12.7 g/dL (ref 12.0–15.0)
Lymphocytes Relative: 26.1 % (ref 12.0–46.0)
Lymphs Abs: 1.7 10*3/uL (ref 0.7–4.0)
MCHC: 32.7 g/dL (ref 30.0–36.0)
MCV: 92.9 fl (ref 78.0–100.0)
Monocytes Absolute: 0.5 10*3/uL (ref 0.1–1.0)
Monocytes Relative: 7.5 % (ref 3.0–12.0)
Neutro Abs: 4 10*3/uL (ref 1.4–7.7)
Neutrophils Relative %: 63.4 % (ref 43.0–77.0)
Platelets: 223 10*3/uL (ref 150.0–400.0)
RBC: 4.17 Mil/uL (ref 3.87–5.11)
RDW: 14 % (ref 11.5–15.5)
WBC: 6.3 10*3/uL (ref 4.0–10.5)

## 2021-04-03 LAB — LIPID PANEL
Cholesterol: 259 mg/dL — ABNORMAL HIGH (ref 0–200)
HDL: 95.4 mg/dL (ref >39.00)
LDL Cholesterol: 150 mg/dL — ABNORMAL HIGH (ref 0–99)
NonHDL: 163.14
Total CHOL/HDL Ratio: 3
Triglycerides: 64 mg/dL (ref 0.0–149.0)
VLDL: 12.8 mg/dL (ref 0.0–40.0)

## 2021-04-03 LAB — TSH: TSH: 0.8 u[IU]/mL (ref 0.35–5.50)

## 2021-04-03 LAB — VITAMIN D 25 HYDROXY (VIT D DEFICIENCY, FRACTURES): VITD: 30.88 ng/mL (ref 30.00–100.00)

## 2021-04-03 NOTE — Progress Notes (Signed)
Subjective:  Patient ID: Lisa Lamb, female    DOB: February 04, 1956  Age: 66 y.o. MRN: [id number]  CC: The primary encounter diagnosis was History of iron deficiency anemia. Diagnoses of Chronic idiopathic constipation, Vitamin D deficiency, Abdominal bloating, Chronic constipation, and Osteoarthritis of finger of right hand were also pertinent to this visit.   This visit occurred during the SARS-CoV-2 public health emergency.  Safety protocols were in place, including screening questions prior to the visit, additional usage of staff PPE, and extensive cleaning of exam room while observing appropriate contact time as indicated for disinfecting solutions.    HPI Lisa Lamb presents for evaluation of  abdominal symptoms .  She has not een seen since 2018 Chief Complaint  Patient presents with   Acute Visit    Digestive issues, bloating and constipation    Cc:  evening/nocturnal abd pain accompanied by bloating,  has  a hard time taking a deep breath.  All symptoms are made worse by lying down , in spite of eating her last meal by 4 pm . Symptoms resolve by morning.  Has lost the morning urge to defecate . Lots of borygborygmi  at night.   Drinking Coffee used to incerase her regularity but is no longer helping.  Drinks water,  herbal tea. Appetite is good ,  thinks about food all day long   but gets full quickly.  NO DIARRHEA EXCEPT if she takes magnesium  Has tried taking  digestive enzymes which do not help.  Denies reflux,  no prior EGD.  No weight loss.  Last colonoscopy 2009.   Using psyllium fiber to achieve normal BMs ,  Found that Florastar and other probiotics tried did not help as much . Miralax and prune juice caused diarrhea Trying to increase protein in diet.   History of chronic constipation  since childhood managed with healthy diet    2)joint deformities in hands.  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  Right index finger the worst.   No progression in years.  Treated  with [facility] injection by [facility]  Outpatient Medications Prior to Visit  Medication Sig Dispense Refill   SUMAtriptan (IMITREX) 100 MG tablet TAKE 1 TABLET BY MOUTH ONCE. MAY REPEAT IN 2 HOURS IF HEADACHE PERSISTS OR RECURS 10 tablet 5   clonazePAM (KLONOPIN) 0.5 MG tablet Take 1 tablet (0.5 mg total) by mouth at bedtime. Or insomnia (Patient not taking: Reported on 04/03/2021) 30 tablet 5   No facility-administered medications prior to visit.    Review of Systems;  Patient denies headache, fevers, malaise, unintentional weight loss, skin rash, eye pain, sinus congestion and sinus pain, sore throat, dysphagia,  hemoptysis , cough, dyspnea, wheezing, chest pain, palpitations, orthopnea, edema, abdominal pain, nausea, melena, diarrhea, constipation, flank pain, dysuria, hematuria, urinary  Frequency, nocturia, numbness, tingling, seizures,  Focal weakness, Loss of consciousness,  Tremor, insomnia, depression, anxiety, and suicidal ideation.      Objective:  BP 130/80 (BP Location: Left Arm, Patient Position: Sitting, Cuff Size: Normal)    Pulse 67    Temp 98.2 F (36.8 C) (Oral)    Ht 5\' 6"  (1.676 m)    Wt 109 lb 1.9 oz (49.5 kg)    SpO2 94%    BMI 17.61 kg/m   BP Readings from Last 3 Encounters:  04/03/21 130/80  08/25/16 110/64  09/10/15 102/66    Wt Readings from Last 3 Encounters:  04/03/21 109 lb 1.9 oz (49.5  kg)  08/25/16 106 lb 9.6 oz (48.4 kg)  09/10/15 105 lb (47.6 kg)    General appearance: alert, cooperative and appears stated age Ears: normal TM's and external ear canals both ears Throat: lips, mucosa, and tongue normal; teeth and gums normal Neck: no adenopathy, no carotid bruit, supple, symmetrical, trachea midline and thyroid not enlarged, symmetric, no tenderness/mass/nodules Back: symmetric, no curvature. ROM normal. No CVA tenderness. Lungs: clear to auscultation bilaterally Heart: regular rate and rhythm, S1, S2 normal, no murmur, click, rub or  gallop Abdomen: soft, non-tender; bowel sounds normal; no masses,  no organomegaly Pulses: 2+ and symmetric Skin: Skin color, texture, turgor normal. No rashes or lesions Lymph nodes: Cervical, supraclavicular, and axillary nodes normal.  No results found for: HGBA1C  Lab Results  Component Value Date   CREATININE 0.83 04/03/2021   CREATININE 0.90 09/10/2015   CREATININE 1.0 04/06/2013    Lab Results  Component Value Date   WBC 6.3 04/03/2021   HGB 12.7 04/03/2021   HCT 38.8 04/03/2021   PLT 223.0 04/03/2021   GLUCOSE 81 04/03/2021   CHOL 259 (H) 04/03/2021   TRIG 64.0 04/03/2021   HDL 95.40 04/03/2021   LDLDIRECT 88.6 04/06/2013   LDLCALC 150 (H) 04/03/2021   ALT 7 04/03/2021   AST 15 04/03/2021   NA 139 04/03/2021   K 4.2 04/03/2021   CL 102 04/03/2021   CREATININE 0.83 04/03/2021   BUN 13 04/03/2021   CO2 31 04/03/2021   TSH 0.80 04/03/2021    DG Finger Index Right  Result Date: 05/30/2013 PRIOR REPORT IMPORTED FROM AN EXTERNAL SYSTEM CLINICAL DATA:  Right hand injury. Pain in the MCP joint and PIP joint of the index finger. EXAM: RIGHT INDEX FINGER 2+V COMPARISON:  None. FINDINGS: Moderate DIP joint osteoarthritis is present. The alignment is anatomic. No fracture. No erosions. MCP joint appears within normal limits. IMPRESSION: DIP joint osteoarthritis.  No acute osseous abnormality. Electronically Signed   By: Andreas Newport M.D.   On: 05/30/2013 14:22     Assessment & Plan:   Problem List Items Addressed This Visit     OA (osteoarthritis) of finger    Confirmed remotely with plain films,  With no recent progression and no evidence of erosion on plain films.  Continue prn use of NSAIDs. s.        Chronic constipation    Accompanied by bloating and early satiety.  Screening for hypothyroid, electrolyte abnormalities has been done and is normal.  Referring to  Dr Servando Snare for colon ca screening       Other Visit Diagnoses     History of iron deficiency anemia     -  Primary   Relevant Orders   CBC with Differential/Platelet (Completed)   IBC + Ferritin (Completed)   Chronic idiopathic constipation       Relevant Orders   Comprehensive metabolic panel (Completed)   Lipid panel (Completed)   TSH (Completed)   Vitamin D deficiency       Relevant Orders   VITAMIN D 25 Hydroxy (Vit-D Deficiency, Fractures) (Completed)   Abdominal bloating       Relevant Orders   DG Abd 1 View (Completed)       I spent 30 minutes dedicated to the care of this patient on the date of this encounter to include pre-visit review of patient's medical history,  most recent imaging studies, Face-to-face time with the patient , and post visit ordering of testing and therapeutics.  Follow-up: Return in about 3 months (around 07/01/2021).   Crecencio Mc, MD

## 2021-04-03 NOTE — Patient Instructions (Signed)
WElcome back!  I am making a referral to [facility] to get you caught up on screening for colon CA

## 2021-04-05 DIAGNOSIS — K5909 Other constipation: Secondary | ICD-10-CM | POA: Insufficient documentation

## 2021-04-05 NOTE — Assessment & Plan Note (Addendum)
Confirmed remotely with plain films,  With no recent progression and no evidence of erosion on plain films.  Continue prn use of NSAIDs. s.

## 2021-04-05 NOTE — Assessment & Plan Note (Addendum)
Accompanied by bloating and early satiety.  Screening for hypothyroid, electrolyte abnormalities has been done and is normal.  Referring to  Dr Servando Snare for colon ca screening

## 2021-06-10 ENCOUNTER — Encounter: Payer: Self-pay | Admitting: Internal Medicine

## 2021-07-03 ENCOUNTER — Ambulatory Visit: Payer: 59 | Admitting: Internal Medicine

## 2021-07-15 ENCOUNTER — Encounter: Payer: Self-pay | Admitting: Internal Medicine

## 2021-07-15 ENCOUNTER — Ambulatory Visit (INDEPENDENT_AMBULATORY_CARE_PROVIDER_SITE_OTHER): Payer: Medicare Other | Admitting: Internal Medicine

## 2021-07-15 VITALS — BP 116/72 | HR 66 | Temp 98.7°F | Ht 66.0 in | Wt 113.6 lb

## 2021-07-15 DIAGNOSIS — M19041 Primary osteoarthritis, right hand: Secondary | ICD-10-CM | POA: Diagnosis not present

## 2021-07-15 DIAGNOSIS — Z1211 Encounter for screening for malignant neoplasm of colon: Secondary | ICD-10-CM

## 2021-07-15 DIAGNOSIS — M151 Heberden's nodes (with arthropathy): Secondary | ICD-10-CM

## 2021-07-15 DIAGNOSIS — Z8619 Personal history of other infectious and parasitic diseases: Secondary | ICD-10-CM | POA: Diagnosis not present

## 2021-07-15 DIAGNOSIS — K5909 Other constipation: Secondary | ICD-10-CM

## 2021-07-15 DIAGNOSIS — R636 Underweight: Secondary | ICD-10-CM

## 2021-07-15 DIAGNOSIS — M19049 Primary osteoarthritis, unspecified hand: Secondary | ICD-10-CM

## 2021-07-15 DIAGNOSIS — M19042 Primary osteoarthritis, left hand: Secondary | ICD-10-CM

## 2021-07-15 MED ORDER — DOXYCYCLINE HYCLATE 100 MG PO TABS: 100.0000 mg | ORAL_TABLET | Freq: Two times a day (BID) | ORAL | 0 refills | Status: DC

## 2021-07-15 NOTE — Progress Notes (Signed)
? ?Subjective:  ?Patient ID: Lisa Lamb, female    DOB: May 26, 1955  Age: 66 y.o. MRN: [id number] ? ?CC: The primary encounter diagnosis was Colon cancer screening. Diagnoses of History of [facility] spotted fever, Heberden's nodes of both hands, Primary osteoarthritis of both hands, Osteoarthritis of finger, unspecified laterality, Chronic constipation, and Underweight were also pertinent to this visit. ? ? ?HPI ?Lisa Lamb presents for  ?Chief Complaint  ?Patient presents with  ? Follow-up  ?  3 month follow up   ? ?1) CONSTIPATION IMPROVED.  She has been taking potassium/magnesium supplement and using a stool for foot elevation,  moving bowels daily. DOES NOT WANT REFERRAL FOR COLONOSCOPY ? 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  TREATED WITH DOXYCYCLINE.  Positive RMSF IgG done in May 2014 titer was elevated .   She notes that the swelling in the middle and ring fingers started then   ? ?3) OA with deviation of distal joints due to excessive formation of  Heberden's nodes.  Both index fingers are now deviating medially at the DIP.  Referral to [facility]  on [address] in [place])  ? ? ?Outpatient Medications Prior to Visit  ?Medication Sig Dispense Refill  ? SUMAtriptan (IMITREX) 100 MG tablet TAKE 1 TABLET BY MOUTH ONCE. MAY REPEAT IN 2 HOURS IF HEADACHE PERSISTS OR RECURS 10 tablet 5  ? ?No facility-administered medications prior to visit.  ? ? ?Review of Systems; ? ?Patient denies headache, fevers, malaise, unintentional weight loss, skin rash, eye pain, sinus congestion and sinus pain, sore throat, dysphagia,  hemoptysis , cough, dyspnea, wheezing, chest pain, palpitations, orthopnea, edema, abdominal pain, nausea, melena, diarrhea, constipation, flank pain, dysuria, hematuria, urinary  Frequency, nocturia, numbness, tingling, seizures,  Focal weakness, Loss of consciousness,  Tremor, insomnia, depression, anxiety, and suicidal ideation.    ? ? ? ?Objective:  ?BP 116/72 (BP Location: Left Arm, Patient Position: Sitting, Cuff Size: Normal)   Pulse 66   Temp 98.7 ?F (37.1 ?C) (Oral)   Ht 5\' 6"  (1.676 m)   Wt 113 lb 9.6 oz (51.5 kg)   SpO2 95%   BMI 18.34 kg/m?  ? ?BP Readings from Last 3 Encounters:  ?07/15/21 116/72  ?04/03/21 130/80  ?08/25/16 110/64  ? ? ?Wt Readings from Last 3 Encounters:  ?07/15/21 113 lb 9.6 oz (51.5 kg)  ?04/03/21 109 lb 1.9 oz (49.5 kg)  ?08/25/16 106 lb 9.6 oz (48.4 kg)  ? ? ?General appearance: alert, cooperative and appears stated age ?Ears: normal TM's and external ear canals both ears ?Throat: lips, mucosa, and tongue normal; teeth and gums normal ?Neck: no adenopathy, no carotid bruit, supple, symmetrical, trachea midline and thyroid not enlarged, symmetric, no tenderness/mass/nodules ?Back: symmetric, no curvature. ROM normal. No CVA tenderness. ?Lungs: clear to auscultation bilaterally ?Heart: regular rate and rhythm, S1, S2 normal, no murmur, click, rub or gallop ?Abdomen: soft, non-tender; bowel sounds normal; no masses,  no organomegaly ?Pulses: 2+ and symmetric ?Skin: Skin color, texture, turgor normal. No rashes or lesions ?Lymph nodes: Cervical, supraclavicular, and axillary nodes normal. ? ?No results found for: HGBA1C ? ?Lab Results  ?Component Value Date  ? CREATININE 0.83 04/03/2021  ? CREATININE 0.90 09/10/2015  ? CREATININE 1.0 04/06/2013  ? ? ?Lab Results  ?Component Value Date  ? WBC 6.3 04/03/2021  ? HGB 12.7 04/03/2021  ? HCT 38.8 04/03/2021  ? PLT 223.0 04/03/2021  ? GLUCOSE 81 04/03/2021  ?  CHOL 259 (H) 04/03/2021  ? TRIG 64.0 04/03/2021  ? HDL 95.40 04/03/2021  ? LDLDIRECT 88.6 04/06/2013  ? LDLCALC 150 (H) 04/03/2021  ? ALT 7 04/03/2021  ? AST 15 04/03/2021  ? NA 139 04/03/2021  ? K 4.2 04/03/2021  ? CL 102 04/03/2021  ? CREATININE 0.83 04/03/2021  ? BUN 13 04/03/2021  ? CO2 31 04/03/2021  ? TSH 0.80 04/03/2021  ? ? ? ?Assessment & Plan:  ? ?Problem List Items Addressed This Visit   ? ? Chronic  constipation  ?  Improved frequency and consistency with k/Mg supplementation and increased water intake.  She declines colonoscopy ? ?  ?  ? History of [facility] spotted fever  ?  IgG was positive in 2014 , , IgM was negative , during an acute illness. Doxycycline rx put on fil at pharmacy given recurrent tick exposure ? ?  ?  ? OA (osteoarthritis) of finger  ?  She has developed medial deviation of the DIPS of both index fingers which is activity limiting.  Referral to hand surgeon for discussion of options of treatment  ? ?  ?  ? Underweight  ?  Improved with better diet  . BMI is now 18  ? ?  ?  ? ?Other Visit Diagnoses   ? ? Colon cancer screening    -  Primary  ? Relevant Orders  ? Cologuard  ? Heberden's nodes of both hands      ? Relevant Orders  ? Ambulatory referral to Orthopedic Surgery  ? Primary osteoarthritis of both hands      ? Relevant Orders  ? Ambulatory referral to Orthopedic Surgery  ? ?  ? ? ?I spent a total of  30   minutes with this patient in a face to face visit on the date of this encounter reviewing the last office visit with me in March ,  last colonoscopy,  most recent labs and previous labs from 2014, patient'ss diet and eating habits,    and post visit ordering of testing and therapeutics.   ? ?Follow-up: Return in about 3 months (around 10/15/2021). ? ? ?Sherlene Shams, MD ?

## 2021-07-15 NOTE — Assessment & Plan Note (Signed)
Improved frequency and consistency with k/Mg supplementation and increased water intake.  She declines colonoscopy ?

## 2021-07-15 NOTE — Patient Instructions (Addendum)
?  [facility] Spotted Fever  can present with symptoms of  fever and a headache, NOT a rash;  the occurrence of these symptoms during spring/summer/fall in the context of recent tick exposure is RMSF until proven otherwise and should be treated immediately with doxycycline.  I have sent this rx to your pharmacy to use if you develop these symptoms.   ? ? ? ?I will initiate the order for your colon cancer screening  Test, the one called  Cologuard.  It will be delivered to your house, and you will send off a stool sample in the envelope it provides.   ? ?Return in 3 months for your Medicare Physical (PAP smear will be done) ? ?Referral to [facility] in progress  ?

## 2021-07-15 NOTE — Assessment & Plan Note (Signed)
She has developed medial deviation of the DIPS of both index fingers which is activity limiting.  Referral to hand surgeon for discussion of options of treatment  ?

## 2021-07-15 NOTE — Addendum Note (Signed)
Addended by: Crecencio Mc on: 07/15/2021 02:20 PM ? ? Modules accepted: Level of Service ? ?

## 2021-07-15 NOTE — Assessment & Plan Note (Addendum)
IgG was positive in 2014 , , IgM was negative , during an acute illness. Doxycycline rx put on fil at pharmacy given recurrent tick exposure ?

## 2021-07-15 NOTE — Assessment & Plan Note (Signed)
Improved with better diet  . BMI is now 18  ?

## 2021-08-11 ENCOUNTER — Telehealth: Payer: Self-pay

## 2021-08-11 NOTE — Telephone Encounter (Signed)
Attempted to call pt. No answer no voicemail.  

## 2021-08-11 NOTE — Telephone Encounter (Signed)
-----   Message from Sandy Salaam, [place] sent at 07/16/2021  4:53 PM EDT ----- Schedule pt for a medicare wellness ----- Message ----- From: Sherlene Shams, MD Sent: 07/15/2021   2:20 PM EDT To: Dennie Bible, RN  Please let Olegario Messier know so the pre visit history can be done

## 2021-10-16 ENCOUNTER — Ambulatory Visit: Payer: Medicare Other | Admitting: Internal Medicine

## 2022-05-11 ENCOUNTER — Ambulatory Visit: Payer: Medicare Other | Admitting: Internal Medicine

## 2022-06-01 ENCOUNTER — Telehealth: Payer: Self-pay | Admitting: Internal Medicine

## 2022-06-01 NOTE — Telephone Encounter (Signed)
Contacted Lisa Lamb to schedule their annual wellness visit. Appointment made for 06/07/2022.  Thank you,  [place] Direct dial  [phone]

## 2022-06-07 ENCOUNTER — Ambulatory Visit (INDEPENDENT_AMBULATORY_CARE_PROVIDER_SITE_OTHER): Payer: Medicare Other

## 2022-06-07 ENCOUNTER — Other Ambulatory Visit: Payer: Self-pay

## 2022-06-07 VITALS — Ht 66.0 in | Wt 113.0 lb

## 2022-06-07 DIAGNOSIS — Z0000 Encounter for general adult medical examination without abnormal findings: Secondary | ICD-10-CM | POA: Diagnosis not present

## 2022-06-07 MED ORDER — DOXYCYCLINE HYCLATE 100 MG PO TABS: 100.0000 mg | ORAL_TABLET | Freq: Two times a day (BID) | ORAL | 0 refills | Status: AC

## 2022-06-07 NOTE — Progress Notes (Addendum)
Subjective:   Lisa Lamb is a 67 y.o. female who presents for an Initial Medicare Annual Wellness Visit.  Review of Systems    No ROS.  Medicare Wellness Virtual Visit.  Visual/audio telehealth visit, UTA vital signs.   See social history for additional risk factors.   Cardiac Risk Factors include: advanced age (>64men, >19 women)     Objective:    Today's Vitals   06/07/22 0848  Weight: 113 lb (51.3 kg)  Height: 5\' 6"  (1.676 m)   Body mass index is 18.24 kg/m.     06/07/2022    8:52 AM  Advanced Directives  Does Patient Have a Medical Advance Directive? No  Would patient like information on creating a medical advance directive? No - Patient declined    Current Medications (verified) Outpatient Encounter Medications as of 06/07/2022  Medication Sig   doxycycline (VIBRA-TABS) 100 MG tablet Take 1 tablet (100 mg total) by mouth 2 (two) times daily.   SUMAtriptan (IMITREX) 100 MG tablet TAKE 1 TABLET BY MOUTH ONCE. MAY REPEAT IN 2 HOURS IF HEADACHE PERSISTS OR RECURS   No facility-administered encounter medications on file as of 06/07/2022.    Allergies (verified) Patient has no known allergies.   History: Past Medical History:  Diagnosis Date   Anemia    DVT (deep venous thrombosis)    Migraine headache    Past Surgical History:  Procedure Laterality Date   CESAREAN SECTION  1988   Family History  Problem Relation Age of Onset   Cancer Mother 43       ovarian   Heart disease Father        before age 38   Cancer Father    Heart attack Father    Other Father        varicose veins   Heart disease Maternal Grandmother    Stroke Maternal Grandmother    Social History   Socioeconomic History   Marital status: Divorced    Spouse name: Not on file   Number of children: Not on file   Years of education: Not on file   Highest education level: Not on file  Occupational History   Not on file  Tobacco Use   Smoking status: Never   Smokeless tobacco:  Never  Substance and Sexual Activity   Alcohol use: Yes    Alcohol/week: 2.0 standard drinks of alcohol    Types: 2 Glasses of wine per week   Drug use: No   Sexual activity: Not Currently  Other Topics Concern   Not on file  Social History Narrative   Not on file   Social Determinants of Health   Financial Resource Strain: Patient Declined (06/07/2022)   Overall Financial Resource Strain (CARDIA)    Difficulty of Paying Living Expenses: Patient declined  Food Insecurity: No Food Insecurity (06/07/2022)   Hunger Vital Sign    Worried About Running Out of Food in the Last Year: Never true    Ran Out of Food in the Last Year: Never true  Transportation Needs: No Transportation Needs (06/07/2022)   PRAPARE - Administrator, Civil Service (Medical): No    Lack of Transportation (Non-Medical): No  Physical Activity: Insufficiently Active (06/07/2022)   Exercise Vital Sign    Days of Exercise per Week: 5 days    Minutes of Exercise per Session: 20 min  Stress: No Stress Concern Present (06/07/2022)   Lisa Lamb  Feeling of Stress : Not at all  Social Connections: Unknown (06/07/2022)   Social Connection and Isolation Panel [NHANES]    Frequency of Communication with Friends and Family: Three times a week    Frequency of Social Gatherings with Friends and Family: More than three times a week    Attends Religious Services: Not on file    Active Member of Clubs or Organizations: Yes    Attends Engineer, structural: More than 4 times per year    Marital Status: Patient declined    Tobacco Counseling Counseling given: Not Answered   Clinical Intake:  Pre-visit preparation completed: Yes        Diabetes: No  How often do you need to have someone help you when you read instructions, pamphlets, or other written materials from your doctor or pharmacy?: 1 - Never    Interpreter Needed?: No       Activities of Daily Living    06/07/2022    8:17 AM  In your present state of health, do you have any difficulty performing the following activities:  Hearing? 0  Vision? 0  Difficulty concentrating or making decisions? 0  Walking or climbing stairs? 0  Dressing or bathing? 0  Doing errands, shopping? 0  Preparing Food and eating ? N  Using the Toilet? N  In the past six months, have you accidently leaked urine? N  Do you have problems with loss of bowel control? N  Managing your Medications? N  Managing your Finances? N  Housekeeping or managing your Housekeeping? N    Patient Care Team: Sherlene Shams, MD as PCP - General (Internal Medicine)  Indicate any recent Medical Services you may have received from other than [facility] providers in the past year (date may be approximate).     Assessment:   This is a routine wellness examination for Lisa Lamb.  I connected with  Lisa Lamb on 06/07/22 by a audio enabled telemedicine application and verified that I am speaking with the correct person using two identifiers.  Patient Location: Home  Provider Location: Office/Clinic  I discussed the limitations of evaluation and management by telemedicine. The patient expressed understanding and agreed to proceed.   Hearing/Vision screen Hearing Screening - Comments:: Patient is able to hear conversational tones without difficulty.  No issues reported.   Vision Screening - Comments:: Followed by [facility] Wears corrective lenses They have seen their ophthalmologist in the last 12 months.    Dietary issues and exercise activities discussed: Current Exercise Habits: Home exercise routine, Type of exercise: walking, Time (Minutes): 20, Frequency (Times/Week): 5, Weekly Exercise (Minutes/Week): 100, Intensity: Mild   Goals Addressed               This Visit's Progress     Patient Stated     Increase physical activity (pt-stated)        20 minutes of active movement  everyday       Depression Screen    06/07/2022    8:53 AM 07/15/2021    1:31 PM 04/03/2021    9:48 AM  PHQ 2/9 Scores  PHQ - 2 Score 0 0 0    Fall Risk    06/07/2022    8:17 AM 07/15/2021    1:31 PM 04/03/2021    9:48 AM  Fall Risk   Falls in the past year? 0 0 0  Injury with Fall? 0    Risk for fall due to :  No Fall  Risks No Fall Risks  Follow up Falls evaluation completed;Falls prevention discussed Falls evaluation completed Falls evaluation completed    FALL RISK PREVENTION PERTAINING TO THE HOME: Home free of loose throw rugs in walkways, pet beds, electrical cords, etc? Yes  Adequate lighting in your home to reduce risk of falls? Yes   ASSISTIVE DEVICES UTILIZED TO PREVENT FALLS:  Life alert? No  Use of a cane, walker or w/c? No  Grab bars in the bathroom? No  Shower chair or bench in shower? No  Elevated toilet seat or a handicapped toilet? No   TIMED UP AND GO: Was the test performed? No .    Cognitive Function:        06/07/2022    8:55 AM  6CIT Screen  What Year? 0 points  What month? 0 points  What time? 0 points  Count back from 20 0 points  Months in reverse 0 points  Repeat phrase 0 points  Total Score 0 points    Immunizations Immunization History  Administered Date(s) Administered   Tdap 04/06/2013   Tdap- declined.   Pneumococcal vaccine status: Declined,  Education has been provided regarding the importance of this vaccine but patient still declined. Advised may receive this vaccine at local pharmacy or Health Dept. Aware to provide a copy of the vaccination record if obtained from local pharmacy or Health Dept. Verbalized acceptance and understanding.   Covid-19 vaccine status: Declined, Education has been provided regarding the importance of this vaccine but patient still declined. Advised may receive this vaccine at local pharmacy or Health Dept.or vaccine clinic. Aware to provide a copy of the vaccination record if obtained from local  pharmacy or Health Dept. Verbalized acceptance and understanding.  Shingles vaccine- decline.   Screening Tests Health Maintenance  Topic Date Due   MAMMOGRAM  07/16/2022 (Originally 11/29/2010)   DEXA SCAN  07/16/2022 (Originally 04/30/2021)   COLONOSCOPY (Pts [id number] Insurance coverage will need to be confirmed)  07/16/2022 (Originally 11/28/2017)   INFLUENZA VACCINE  09/30/2022   DTaP/Tdap/Td (2 - Td or Tdap) 04/07/2023   Medicare Annual Wellness (AWV)  06/07/2023   HPV VACCINES  Aged Out   Pneumonia Vaccine 4065+ Years old  Discontinued   COVID-19 Vaccine  Discontinued   Hepatitis C Screening  Discontinued   Zoster Vaccines- Shingrix  Discontinued    Health Maintenance There are no preventive care reminders to display for this patient.  Lung Cancer Screening: (Low Dose CT Chest recommended if Age 75-80 years, 30 pack-year currently smoking OR have quit w/in 15years.) does not qualify.   Hepatitis C Screening: deferred per patient preference.   Vision Screening: Recommended annual ophthalmology exams for early detection of glaucoma and other disorders of the eye.  Dental Screening: Recommended annual dental exams for proper oral hygiene  Community Resource Referral / Chronic Care Management: CRR required this visit?  No   CCM required this visit?  No      Plan:   Patient request pharmacy change and Rx on file sent to new pharmacy. Advised to schedule routine follow up.  I have personally reviewed and noted the following in the patient's chart:   Medical and social history Use of alcohol, tobacco or illicit drugs  Current medications and supplements including opioid prescriptions. Patient is not currently taking opioid prescriptions. Functional ability and status Nutritional status Physical activity Advanced directives List of other physicians Hospitalizations, surgeries, and ER visits in previous 12 months Vitals Screenings to include cognitive, depression, and  falls Referrals and appointments  In addition, I have reviewed and discussed with patient certain preventive protocols, quality metrics, and best practice recommendations. A written personalized care plan for preventive services as well as general preventive health recommendations were provided to patient.     Michaelina Blandino L Motley, LPN   03/06/1094     I have reviewed the above information and agree with above.   Duncan Dull, MD

## 2022-06-07 NOTE — Telephone Encounter (Signed)
Patient has changed pharmacies and would like doxycycline (VIBRA-TABS) 100 MG tablet resent/refilled and sent to new pharmacy on file [facility]) . Reports she never picked up the last Rx.

## 2022-06-07 NOTE — Patient Instructions (Addendum)
Lisa Lamb , Thank you for taking time to come for your Medicare Wellness Visit. I appreciate your ongoing commitment to your health goals. Please review the following plan we discussed and let me know if I can assist you in the future.   These are the goals we discussed:  Goals       Patient Stated     Increase physical activity (pt-stated)      20 minutes of active movement everyday        This is a list of the screening recommended for you and due dates:  Health Maintenance  Topic Date Due   Mammogram  07/16/2022*   DEXA scan (bone density measurement)  07/16/2022*   Colon Cancer Screening  07/16/2022*   Flu Shot  09/30/2022   DTaP/Tdap/Td vaccine (2 - Td or Tdap) 04/07/2023   Medicare Annual Wellness Visit  06/07/2023   HPV Vaccine  Aged Out   Pneumonia Vaccine  Discontinued   COVID-19 Vaccine  Discontinued   Hepatitis C Screening: USPSTF Recommendation to screen - Ages 63-79 yo.  Discontinued   Zoster (Shingles) Vaccine  Discontinued  *Topic was postponed. The date shown is not the original due date.   Conditions/risks identified: none new  Next appointment: Follow up in one year for your annual wellness visit    Preventive Care 65 Years and Older, Female Preventive care refers to lifestyle choices and visits with your health care provider that can promote health and wellness. What does preventive care include? A yearly physical exam. This is also called an annual well check. Dental exams once or twice a year. Routine eye exams. Ask your health care provider how often you should have your eyes checked. Personal lifestyle choices, including: Daily care of your teeth and gums. Regular physical activity. Eating a healthy diet. Avoiding tobacco and drug use. Limiting alcohol use. Practicing safe sex. Taking low-dose aspirin every day. Taking vitamin and mineral supplements as recommended by your health care provider. What happens during an annual well check? The  services and screenings done by your health care provider during your annual well check will depend on your age, overall health, lifestyle risk factors, and family history of disease. Counseling  Your health care provider may ask you questions about your: Alcohol use. Tobacco use. Drug use. Emotional well-being. Home and relationship well-being. Sexual activity. Eating habits. History of falls. Memory and ability to understand (cognition). Work and work Astronomer. Reproductive health. Screening  You may have the following tests or measurements: Height, weight, and BMI. Blood pressure. Lipid and cholesterol levels. These may be checked every 5 years, or more frequently if you are over 81 years old. Skin check. Lung cancer screening. You may have this screening every year starting at age 38 if you have a 30-pack-year history of smoking and currently smoke or have quit within the past 15 years. Fecal occult blood test (FOBT) of the stool. You may have this test every year starting at age 54. Flexible sigmoidoscopy or colonoscopy. You may have a sigmoidoscopy every 5 years or a colonoscopy every 10 years starting at age 28. Hepatitis C blood test. Hepatitis B blood test. Sexually transmitted disease (STD) testing. Diabetes screening. This is done by checking your blood sugar (glucose) after you have not eaten for a while (fasting). You may have this done every 1-3 years. Bone density scan. This is done to screen for osteoporosis. You may have this done starting at age 36. Mammogram. This may be done every  1-2 years. Talk to your health care provider about how often you should have regular mammograms. Talk with your health care provider about your test results, treatment options, and if necessary, the need for more tests. Vaccines  Your health care provider may recommend certain vaccines, such as: Influenza vaccine. This is recommended every year. Tetanus, diphtheria, and acellular  pertussis (Tdap, Td) vaccine. You may need a Td booster every 10 years. Zoster vaccine. You may need this after age 40. Pneumococcal 13-valent conjugate (PCV13) vaccine. One dose is recommended after age 98. Pneumococcal polysaccharide (PPSV23) vaccine. One dose is recommended after age 68. Talk to your health care provider about which screenings and vaccines you need and how often you need them. This information is not intended to replace advice given to you by your health care provider. Make sure you discuss any questions you have with your health care provider. Document Released: 03/14/2015 Document Revised: 11/05/2015 Document Reviewed: 12/17/2014 Elsevier Interactive Patient Education  2017 [employer] Prevention in the Home Falls can cause injuries. They can happen to people of all ages. There are many things you can do to make your home safe and to help prevent falls. What can I do on the outside of my home? Regularly fix the edges of walkways and driveways and fix any cracks. Remove anything that might make you trip as you walk through a door, such as a raised step or threshold. Trim any bushes or trees on the path to your home. Use bright outdoor lighting. Clear any walking paths of anything that might make someone trip, such as rocks or tools. Regularly check to see if handrails are loose or broken. Make sure that both sides of any steps have handrails. Any raised decks and porches should have guardrails on the edges. Have any leaves, snow, or ice cleared regularly. Use sand or salt on walking paths during winter. Clean up any spills in your garage right away. This includes oil or grease spills. What can I do in the bathroom? Use night lights. Install grab bars by the toilet and in the tub and shower. Do not use towel bars as grab bars. Use non-skid mats or decals in the tub or shower. If you need to sit down in the shower, use a plastic, non-slip stool. Keep the floor  dry. Clean up any water that spills on the floor as soon as it happens. Remove soap buildup in the tub or shower regularly. Attach bath mats securely with double-sided non-slip rug tape. Do not have throw rugs and other things on the floor that can make you trip. What can I do in the bedroom? Use night lights. Make sure that you have a light by your bed that is easy to reach. Do not use any sheets or blankets that are too big for your bed. They should not hang down onto the floor. Have a firm chair that has side arms. You can use this for support while you get dressed. Do not have throw rugs and other things on the floor that can make you trip. What can I do in the kitchen? Clean up any spills right away. Avoid walking on wet floors. Keep items that you use a lot in easy-to-reach places. If you need to reach something above you, use a strong step stool that has a grab bar. Keep electrical cords out of the way. Do not use floor polish or wax that makes floors slippery. If you must use wax, use non-skid  floor wax. Do not have throw rugs and other things on the floor that can make you trip. What can I do with my stairs? Do not leave any items on the stairs. Make sure that there are handrails on both sides of the stairs and use them. Fix handrails that are broken or loose. Make sure that handrails are as long as the stairways. Check any carpeting to make sure that it is firmly attached to the stairs. Fix any carpet that is loose or worn. Avoid having throw rugs at the top or bottom of the stairs. If you do have throw rugs, attach them to the floor with carpet tape. Make sure that you have a light switch at the top of the stairs and the bottom of the stairs. If you do not have them, ask someone to add them for you. What else can I do to help prevent falls? Wear shoes that: Do not have high heels. Have rubber bottoms. Are comfortable and fit you well. Are closed at the toe. Do not wear  sandals. If you use a stepladder: Make sure that it is fully opened. Do not climb a closed stepladder. Make sure that both sides of the stepladder are locked into place. Ask someone to hold it for you, if possible. Clearly mark and make sure that you can see: Any grab bars or handrails. First and last steps. Where the edge of each step is. Use tools that help you move around (mobility aids) if they are needed. These include: Canes. Walkers. Scooters. Crutches. Turn on the lights when you go into a dark area. Replace any light bulbs as soon as they burn out. Set up your furniture so you have a clear path. Avoid moving your furniture around. If any of your floors are uneven, fix them. If there are any pets around you, be aware of where they are. Review your medicines with your doctor. Some medicines can make you feel dizzy. This can increase your chance of falling. Ask your doctor what other things that you can do to help prevent falls. This information is not intended to replace advice given to you by your health care provider. Make sure you discuss any questions you have with your health care provider. Document Released: 12/12/2008 Document Revised: 07/24/2015 Document Reviewed: 03/22/2014 Elsevier Interactive Patient Education  2017 [employer].

## 2023-04-09 IMAGING — DX DG ABDOMEN 1V
1 series · 1 of 1 positions shown · non-contrast
Comparison: None.

CLINICAL DATA: Abdominal bloating.

EXAM:
ABDOMEN - 1 VIEW

[abdomen standing ap]
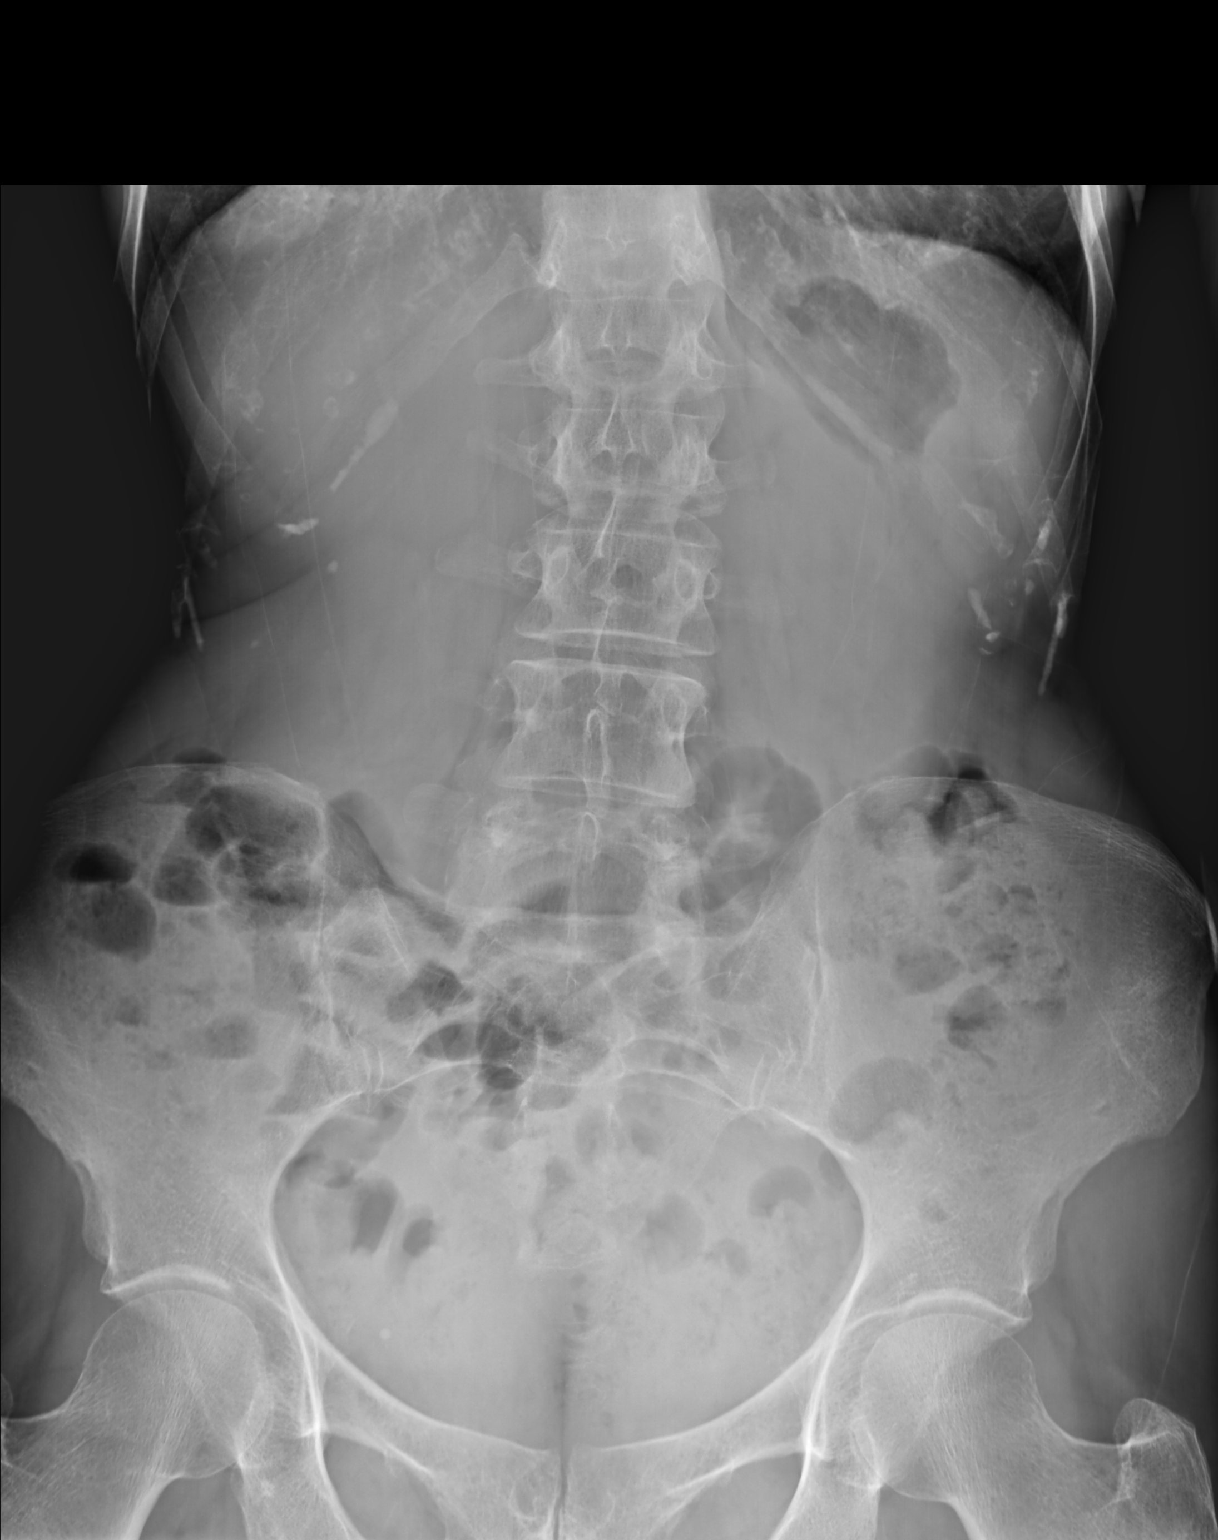

[1 of 1 positions shown; findings below may reference images not displayed]

FINDINGS: The bowel gas pattern is normal. No radio-opaque calculi or other
significant radiographic abnormality are seen.
IMPRESSION: Negative.

## 2023-05-26 DIAGNOSIS — H43822 Vitreomacular adhesion, left eye: Secondary | ICD-10-CM | POA: Diagnosis not present
